# Patient Record
Sex: Female | Born: 1975 | Race: White | Hispanic: No | Marital: Single | Smoking: Current every day smoker
Health system: Southern US, Community
[De-identification: ages and names within clinical notes are randomized; demographics above are authoritative.]

## PROBLEM LIST (undated history)

## (undated) VITALS — BP 120/73 | HR 108 | Temp 98.8°F | Resp 18 | Ht 60.0 in | Wt 154.0 lb

## (undated) DIAGNOSIS — F32A Depression, unspecified: Secondary | ICD-10-CM

## (undated) DIAGNOSIS — F319 Bipolar disorder, unspecified: Secondary | ICD-10-CM

## (undated) DIAGNOSIS — F329 Major depressive disorder, single episode, unspecified: Secondary | ICD-10-CM

## (undated) HISTORY — PX: TUBAL LIGATION: SHX77

---

## 2002-07-11 ENCOUNTER — Inpatient Hospital Stay (HOSPITAL_COMMUNITY): Admission: EM | Admit: 2002-07-11 | Discharge: 2002-07-15 | Payer: Self-pay | Admitting: *Deleted

## 2003-07-06 ENCOUNTER — Inpatient Hospital Stay (HOSPITAL_COMMUNITY): Admission: AD | Admit: 2003-07-06 | Discharge: 2003-07-12 | Payer: Self-pay | Admitting: Psychiatry

## 2007-02-01 ENCOUNTER — Inpatient Hospital Stay (HOSPITAL_COMMUNITY): Admission: AD | Admit: 2007-02-01 | Discharge: 2007-02-01 | Payer: Self-pay | Admitting: Obstetrics and Gynecology

## 2007-05-21 ENCOUNTER — Inpatient Hospital Stay (HOSPITAL_COMMUNITY): Admission: AD | Admit: 2007-05-21 | Discharge: 2007-05-21 | Payer: Self-pay | Admitting: Obstetrics and Gynecology

## 2007-07-31 ENCOUNTER — Inpatient Hospital Stay (HOSPITAL_COMMUNITY): Admission: AD | Admit: 2007-07-31 | Discharge: 2007-07-31 | Payer: Self-pay | Admitting: Obstetrics and Gynecology

## 2007-10-09 ENCOUNTER — Inpatient Hospital Stay (HOSPITAL_COMMUNITY): Admission: AD | Admit: 2007-10-09 | Discharge: 2007-10-11 | Payer: Self-pay | Admitting: Obstetrics & Gynecology

## 2010-07-21 ENCOUNTER — Inpatient Hospital Stay (HOSPITAL_COMMUNITY)
Admission: AD | Admit: 2010-07-21 | Discharge: 2010-07-22 | Payer: Self-pay | Source: Home / Self Care | Admitting: Obstetrics and Gynecology

## 2010-07-21 ENCOUNTER — Ambulatory Visit: Payer: Self-pay | Admitting: Nurse Practitioner

## 2011-03-10 LAB — CBC
HCT: 37.9 % (ref 36.0–46.0)
MCH: 32.3 pg (ref 26.0–34.0)
MCHC: 34.4 g/dL (ref 30.0–36.0)
MCV: 94 fL (ref 78.0–100.0)
RDW: 13.3 % (ref 11.5–15.5)

## 2011-03-10 LAB — POCT PREGNANCY, URINE: Preg Test, Ur: NEGATIVE

## 2011-03-10 LAB — WET PREP, GENITAL: Trich, Wet Prep: NONE SEEN

## 2011-03-10 LAB — GC/CHLAMYDIA PROBE AMP, GENITAL: GC Probe Amp, Genital: NEGATIVE
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
## 2011-10-03 LAB — CBC
HCT: 29.4 — ABNORMAL LOW
HCT: 36.5
Hemoglobin: 12.8
MCHC: 34.2
MCV: 93.2
MCV: 94.1
Platelets: 229
Platelets: 280
RBC: 3.91
RDW: 13.1
RDW: 13.3
WBC: 13.7 — ABNORMAL HIGH

## 2011-10-03 LAB — RPR: RPR Ser Ql: NONREACTIVE

## 2011-10-08 LAB — URINALYSIS, ROUTINE W REFLEX MICROSCOPIC
Bilirubin Urine: NEGATIVE
Glucose, UA: NEGATIVE
Hgb urine dipstick: NEGATIVE
Ketones, ur: NEGATIVE
Protein, ur: NEGATIVE

## 2014-06-20 ENCOUNTER — Encounter (HOSPITAL_COMMUNITY): Payer: Self-pay | Admitting: Emergency Medicine

## 2014-06-20 ENCOUNTER — Emergency Department (HOSPITAL_COMMUNITY): Payer: 59

## 2014-06-20 ENCOUNTER — Emergency Department (HOSPITAL_COMMUNITY)
Admission: EM | Admit: 2014-06-20 | Discharge: 2014-06-21 | Disposition: A | Discharge status: Other Acute Inpatient Hospital | Payer: 59 | Attending: Emergency Medicine | Admitting: Emergency Medicine

## 2014-06-20 DIAGNOSIS — F17200 Nicotine dependence, unspecified, uncomplicated: Secondary | ICD-10-CM | POA: Insufficient documentation

## 2014-06-20 DIAGNOSIS — IMO0002 Reserved for concepts with insufficient information to code with codable children: Secondary | ICD-10-CM | POA: Insufficient documentation

## 2014-06-20 DIAGNOSIS — F1310 Sedative, hypnotic or anxiolytic abuse, uncomplicated: Secondary | ICD-10-CM | POA: Insufficient documentation

## 2014-06-20 DIAGNOSIS — T43502A Poisoning by unspecified antipsychotics and neuroleptics, intentional self-harm, initial encounter: Secondary | ICD-10-CM | POA: Insufficient documentation

## 2014-06-20 DIAGNOSIS — T43501A Poisoning by unspecified antipsychotics and neuroleptics, accidental (unintentional), initial encounter: Secondary | ICD-10-CM | POA: Insufficient documentation

## 2014-06-20 DIAGNOSIS — F319 Bipolar disorder, unspecified: Secondary | ICD-10-CM | POA: Insufficient documentation

## 2014-06-20 DIAGNOSIS — Z3202 Encounter for pregnancy test, result negative: Secondary | ICD-10-CM | POA: Insufficient documentation

## 2014-06-20 DIAGNOSIS — R000 Tachycardia, unspecified: Secondary | ICD-10-CM | POA: Insufficient documentation

## 2014-06-20 DIAGNOSIS — T438X2A Poisoning by other psychotropic drugs, intentional self-harm, initial encounter: Secondary | ICD-10-CM | POA: Insufficient documentation

## 2014-06-20 DIAGNOSIS — T50902A Poisoning by unspecified drugs, medicaments and biological substances, intentional self-harm, initial encounter: Secondary | ICD-10-CM

## 2014-06-20 HISTORY — DX: Depression, unspecified: F32.A

## 2014-06-20 HISTORY — DX: Major depressive disorder, single episode, unspecified: F32.9

## 2014-06-20 HISTORY — DX: Bipolar disorder, unspecified: F31.9

## 2014-06-20 LAB — COMPREHENSIVE METABOLIC PANEL
ALT: 16 U/L (ref 0–35)
AST: 13 U/L (ref 0–37)
Albumin: 3.7 g/dL (ref 3.5–5.2)
Alkaline Phosphatase: 56 U/L (ref 39–117)
BUN: 5 mg/dL — ABNORMAL LOW (ref 6–23)
CO2: 24 mEq/L (ref 19–32)
Calcium: 8.9 mg/dL (ref 8.4–10.5)
Chloride: 97 mEq/L (ref 96–112)
Creatinine, Ser: 0.6 mg/dL (ref 0.50–1.10)
GFR calc Af Amer: >90 mL/min (ref >90)
GFR calc non Af Amer: >90 mL/min (ref >90)
Glucose, Bld: 65 mg/dL — ABNORMAL LOW (ref 70–99)
Potassium: 3.4 mEq/L — ABNORMAL LOW (ref 3.7–5.3)
Sodium: 137 mEq/L (ref 137–147)
Total Bilirubin: <0.2 mg/dL — ABNORMAL LOW (ref 0.3–1.2)
Total Protein: 6.9 g/dL (ref 6.0–8.3)

## 2014-06-20 LAB — RAPID URINE DRUG SCREEN, HOSP PERFORMED
Amphetamines: NOT DETECTED
Barbiturates: POSITIVE — AB
Benzodiazepines: NOT DETECTED
Cocaine: NOT DETECTED
Opiates: NOT DETECTED
Tetrahydrocannabinol: NOT DETECTED

## 2014-06-20 LAB — CBC
HCT: 38 % (ref 36.0–46.0)
Hemoglobin: 12.8 g/dL (ref 12.0–15.0)
MCH: 30.6 pg (ref 26.0–34.0)
MCHC: 33.7 g/dL (ref 30.0–36.0)
MCV: 90.9 fL (ref 78.0–100.0)
Platelets: 214 10*3/uL (ref 150–400)
RBC: 4.18 MIL/uL (ref 3.87–5.11)
RDW: 13 % (ref 11.5–15.5)
WBC: 7.3 10*3/uL (ref 4.0–10.5)

## 2014-06-20 LAB — CBG MONITORING, ED: Glucose-Capillary: 117 mg/dL — ABNORMAL HIGH (ref 70–99)

## 2014-06-20 LAB — ACETAMINOPHEN LEVEL: Acetaminophen (Tylenol), Serum: <15 ug/mL (ref 10–30)

## 2014-06-20 LAB — LACTIC ACID, PLASMA: Lactic Acid, Venous: 2.4 mmol/L — ABNORMAL HIGH (ref 0.5–2.2)

## 2014-06-20 LAB — POC URINE PREG, ED: Preg Test, Ur: NEGATIVE

## 2014-06-20 LAB — ETHANOL: Alcohol, Ethyl (B): <11 mg/dL (ref 0–11)

## 2014-06-20 LAB — SALICYLATE LEVEL: Salicylate Lvl: <2 mg/dL — ABNORMAL LOW (ref 2.8–20.0)

## 2014-06-20 LAB — MAGNESIUM: Magnesium: 2.1 mg/dL (ref 1.5–2.5)

## 2014-06-20 MED ORDER — SODIUM CHLORIDE 0.9 % IV BOLUS (SEPSIS)
1000.0000 mL | Freq: Once | INTRAVENOUS | Status: AC
  Administered 2014-06-20: 1000 mL via INTRAVENOUS

## 2014-06-20 MED ORDER — ONDANSETRON HCL 4 MG PO TABS
4.0000 mg | ORAL_TABLET | Freq: Three times a day (TID) | ORAL | Status: DC | PRN
  Administered 2014-06-21: 4 mg via ORAL
  Filled 2014-06-20: qty 1

## 2014-06-20 MED ORDER — SODIUM CHLORIDE 0.9 % IV SOLN
Freq: Once | INTRAVENOUS | Status: AC
  Administered 2014-06-20: 18:00:00 via INTRAVENOUS

## 2014-06-20 MED ORDER — ACETAMINOPHEN 325 MG PO TABS: 650.0000 mg | ORAL_TABLET | ORAL | Status: DC | PRN
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
## 2014-06-20 NOTE — ED Notes (Signed)
MD at bedside. 

## 2014-06-20 NOTE — ED Notes (Signed)
Father at bedside. Pt alert to self , not time, place or situation. Pt states she took a lot of medicine, Seroquel. Father has belongings at bedside.

## 2014-06-20 NOTE — ED Notes (Signed)
Notified Dr. Juleen ChinaKohut of pt BP

## 2014-06-20 NOTE — ED Notes (Signed)
TTS notified with update on pt status.

## 2014-06-20 NOTE — ED Notes (Signed)
Pt has been wanded and belongings given to family. House coverage notified.

## 2014-06-20 NOTE — BH Assessment (Signed)
[facility] will [facility] up TA equipment.   Dr. Juleen ChinaKohut reports Pt is up, alert, recently ate something and is ready to be assessed.   Clista BernhardtNancy Stephenson, [facility] Triage Specialist 06/20/2014 7:21 PM

## 2014-06-20 NOTE — BH Assessment (Signed)
[facility] Assessment Progress Note  Spoke with Dr. Juleen ChinaKohut, took history of pt.  He states that pt is too drowsy to be assessed, but that pt said she overdosed on Seroquel (she takes other meds and may have taken those, too, but could not relay that information to MD).  Father reports that he found pt in a hotel and that pt told him yesterday it was a suicide attempt. He could not get any more information out of her and says she is too drowsy for assessment at this time.  Talked with Pod E Rn who agrees and says they will call TTS when pt is ready to assess.
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
## 2014-06-20 NOTE — ED Notes (Addendum)
Pt family sts that he found her at a motel and she possibly has OD on Seroquel. Pt very slurred in her speech. sts she took some muscle relaxer. Per pt sts that this was an attempt to commit suicide. Pt SI and HI

## 2014-06-20 NOTE — ED Notes (Signed)
Pt speaking TTS couselor via telepysch.

## 2014-06-20 NOTE — ED Notes (Signed)
Dr.Kohut notified of VS

## 2014-06-20 NOTE — ED Notes (Signed)
Patient reports that she does not want to see her husband if he comes to visit. Explained hospital use of XXX patient status.  Patient understands, and decides not to be marked as XXX at this time.  She does not want her husband to visit, but will allow other visitors during visiting hours.

## 2014-06-20 NOTE — ED Notes (Addendum)
TTS attempt to reach pt. RN noted pt not alert to answer questions. Pt responds to voice, occasionally pain.

## 2014-06-21 ENCOUNTER — Encounter (HOSPITAL_COMMUNITY): Payer: Self-pay | Admitting: *Deleted

## 2014-06-21 ENCOUNTER — Inpatient Hospital Stay (HOSPITAL_COMMUNITY)
Admission: EM | Admit: 2014-06-21 | Discharge: 2014-06-28 | DRG: 885 | Disposition: A | Discharge status: Home / Self Care | Payer: 59 | Source: Intra-hospital | Attending: Psychiatry | Admitting: Psychiatry

## 2014-06-21 DIAGNOSIS — G4700 Insomnia, unspecified: Secondary | ICD-10-CM | POA: Diagnosis present

## 2014-06-21 DIAGNOSIS — F4310 Post-traumatic stress disorder, unspecified: Secondary | ICD-10-CM | POA: Diagnosis present

## 2014-06-21 DIAGNOSIS — F410 Panic disorder [episodic paroxysmal anxiety] without agoraphobia: Secondary | ICD-10-CM | POA: Diagnosis present

## 2014-06-21 DIAGNOSIS — F329 Major depressive disorder, single episode, unspecified: Secondary | ICD-10-CM | POA: Diagnosis present

## 2014-06-21 DIAGNOSIS — R45851 Suicidal ideations: Secondary | ICD-10-CM

## 2014-06-21 DIAGNOSIS — Z5900 Homelessness unspecified: Secondary | ICD-10-CM

## 2014-06-21 DIAGNOSIS — F17200 Nicotine dependence, unspecified, uncomplicated: Secondary | ICD-10-CM | POA: Diagnosis present

## 2014-06-21 DIAGNOSIS — F411 Generalized anxiety disorder: Secondary | ICD-10-CM | POA: Diagnosis present

## 2014-06-21 DIAGNOSIS — IMO0001 Reserved for inherently not codable concepts without codable children: Secondary | ICD-10-CM | POA: Diagnosis present

## 2014-06-21 DIAGNOSIS — Z818 Family history of other mental and behavioral disorders: Secondary | ICD-10-CM

## 2014-06-21 DIAGNOSIS — G4710 Hypersomnia, unspecified: Secondary | ICD-10-CM | POA: Diagnosis present

## 2014-06-21 DIAGNOSIS — F3181 Bipolar II disorder: Secondary | ICD-10-CM | POA: Diagnosis present

## 2014-06-21 DIAGNOSIS — M62838 Other muscle spasm: Secondary | ICD-10-CM | POA: Diagnosis present

## 2014-06-21 DIAGNOSIS — F1020 Alcohol dependence, uncomplicated: Secondary | ICD-10-CM | POA: Diagnosis present

## 2014-06-21 DIAGNOSIS — Z598 Other problems related to housing and economic circumstances: Secondary | ICD-10-CM

## 2014-06-21 DIAGNOSIS — F3160 Bipolar disorder, current episode mixed, unspecified: Principal | ICD-10-CM | POA: Diagnosis present

## 2014-06-21 DIAGNOSIS — Z5987 Material hardship due to limited financial resources, not elsewhere classified: Secondary | ICD-10-CM

## 2014-06-21 MED ORDER — QUETIAPINE FUMARATE 200 MG PO TABS
200.0000 mg | ORAL_TABLET | Freq: Every day | ORAL | Status: DC
  Administered 2014-06-22 – 2014-06-27 (×6): 200 mg via ORAL
  Filled 2014-06-21 (×7): qty 1
  Filled 2014-06-21: qty 14
  Filled 2014-06-21: qty 1

## 2014-06-21 MED ORDER — ROPINIROLE HCL 1 MG PO TABS
0.5000 mg | ORAL_TABLET | Freq: Three times a day (TID) | ORAL | Status: DC
  Administered 2014-06-22 – 2014-06-28 (×20): 0.5 mg via ORAL
  Filled 2014-06-21 (×6): qty 1
  Filled 2014-06-21: qty 21
  Filled 2014-06-21: qty 2
  Filled 2014-06-21 (×3): qty 1
  Filled 2014-06-21: qty 21
  Filled 2014-06-21 (×2): qty 1
  Filled 2014-06-21: qty 21
  Filled 2014-06-21 (×13): qty 1

## 2014-06-21 MED ORDER — MAGNESIUM HYDROXIDE 400 MG/5ML PO SUSP: 30.0000 mL | Freq: Every day | ORAL | Status: DC | PRN

## 2014-06-21 MED ORDER — ALUM & MAG HYDROXIDE-SIMETH 200-200-20 MG/5ML PO SUSP
30.0000 mL | ORAL | Status: DC | PRN
  Administered 2014-06-26: 30 mL via ORAL

## 2014-06-21 MED ORDER — CYCLOBENZAPRINE HCL 10 MG PO TABS
10.0000 mg | ORAL_TABLET | Freq: Three times a day (TID) | ORAL | Status: DC | PRN
  Administered 2014-06-23 – 2014-06-27 (×9): 10 mg via ORAL
  Filled 2014-06-21 (×3): qty 2
  Filled 2014-06-21: qty 30
  Filled 2014-06-21 (×6): qty 2

## 2014-06-21 MED ORDER — HYDROXYZINE HCL 50 MG PO TABS
50.0000 mg | ORAL_TABLET | ORAL | Status: DC | PRN
  Administered 2014-06-22 – 2014-06-27 (×6): 50 mg via ORAL
  Filled 2014-06-21 (×6): qty 1

## 2014-06-21 MED ORDER — QUETIAPINE FUMARATE 50 MG PO TABS
50.0000 mg | ORAL_TABLET | Freq: Two times a day (BID) | ORAL | Status: DC
  Administered 2014-06-21 – 2014-06-28 (×14): 50 mg via ORAL
  Filled 2014-06-21: qty 28
  Filled 2014-06-21 (×8): qty 1
  Filled 2014-06-21: qty 28
  Filled 2014-06-21 (×10): qty 1

## 2014-06-21 MED ORDER — GABAPENTIN 300 MG PO CAPS
300.0000 mg | ORAL_CAPSULE | Freq: Three times a day (TID) | ORAL | Status: DC
  Administered 2014-06-22 – 2014-06-23 (×4): 300 mg via ORAL
  Filled 2014-06-21 (×8): qty 1

## 2014-06-21 MED ORDER — GI COCKTAIL ~~LOC~~
30.0000 mL | Freq: Once | ORAL | Status: AC
  Administered 2014-06-21: 30 mL via ORAL
  Filled 2014-06-21: qty 30

## 2014-06-21 MED ORDER — PRAZOSIN HCL 1 MG PO CAPS
1.0000 mg | ORAL_CAPSULE | Freq: Every day | ORAL | Status: DC
Start: 2014-06-21 — End: 2014-06-28
  Administered 2014-06-21 – 2014-06-27 (×7): 1 mg via ORAL
  Filled 2014-06-21: qty 14
  Filled 2014-06-21 (×9): qty 1

## 2014-06-21 MED ORDER — ONDANSETRON HCL 4 MG PO TABS
4.0000 mg | ORAL_TABLET | Freq: Three times a day (TID) | ORAL | Status: DC | PRN
  Administered 2014-06-22: 4 mg via ORAL
  Filled 2014-06-21: qty 1

## 2014-06-21 MED ORDER — ACETAMINOPHEN 325 MG PO TABS
650.0000 mg | ORAL_TABLET | Freq: Four times a day (QID) | ORAL | Status: DC | PRN
  Administered 2014-06-22 – 2014-06-25 (×5): 650 mg via ORAL
  Filled 2014-06-21 (×5): qty 2

## 2014-06-21 MED ORDER — FLUOXETINE HCL 20 MG PO CAPS
40.0000 mg | ORAL_CAPSULE | Freq: Every day | ORAL | Status: DC
  Administered 2014-06-22: 40 mg via ORAL
  Filled 2014-06-21 (×4): qty 2

## 2014-06-21 NOTE — BH Assessment (Signed)
Per Rosey BathKelly Southard, [facility], adult unit is currently at capacity. Contacted the following facilities for placement:  BED AVAILABLE, FAXED CLINICAL INFORMATION: [facility], Per Robyn   AT CAPACITY:  [facility], per [facility]  [facility], per Earl LitesJennifer  [facility], Per [facility]  [facility], Per [facility]  [facility], per [facility]  [facility], per [facility]  [place], Per [facility]  [facility], per [facility]  [facility], per Meryle ReadyWanda  [facility], per [facility]  [place], per Darvin Neighbourshristine  [facility], per [facility]  [facility], per [facility]  [facility], per [facility]  [facility], per [facility]   Ford Ellis Patsy BaltimoreWarrick Jr, [facility], [facility] Triage Specialist [phone]

## 2014-06-21 NOTE — ED Provider Notes (Signed)
Pt awake and alert, vitals normal. Discussed with psych team - awaiting inpatient psych placement.     Suzi RootsKevin E Steinl, MD 06/21/14 [phone]

## 2014-06-21 NOTE — ED Notes (Signed)
Reported to Dr. Lavella LemonsManly that patient is complaining of heart burn. MD acknowledges, and gives verbal orders for gi cocktail.

## 2014-06-22 DIAGNOSIS — F3130 Bipolar disorder, current episode depressed, mild or moderate severity, unspecified: Secondary | ICD-10-CM

## 2014-06-22 DIAGNOSIS — T43502A Poisoning by unspecified antipsychotics and neuroleptics, intentional self-harm, initial encounter: Secondary | ICD-10-CM

## 2014-06-22 DIAGNOSIS — T43501A Poisoning by unspecified antipsychotics and neuroleptics, accidental (unintentional), initial encounter: Secondary | ICD-10-CM

## 2014-06-22 DIAGNOSIS — T438X2A Poisoning by other psychotropic drugs, intentional self-harm, initial encounter: Secondary | ICD-10-CM

## 2014-06-22 DIAGNOSIS — T43294A Poisoning by other antidepressants, undetermined, initial encounter: Secondary | ICD-10-CM

## 2014-06-22 DIAGNOSIS — R45851 Suicidal ideations: Secondary | ICD-10-CM

## 2014-06-22 LAB — URINALYSIS, ROUTINE W REFLEX MICROSCOPIC
BILIRUBIN URINE: NEGATIVE
Glucose, UA: NEGATIVE mg/dL
HGB URINE DIPSTICK: NEGATIVE
Ketones, ur: NEGATIVE mg/dL
NITRITE: NEGATIVE
PH: 7.5 (ref 5.0–8.0)
Protein, ur: NEGATIVE mg/dL
Specific Gravity, Urine: 1.005 (ref 1.005–1.030)
Urobilinogen, UA: 0.2 mg/dL (ref 0.0–1.0)

## 2014-06-22 LAB — URINE MICROSCOPIC-ADD ON

## 2014-06-22 LAB — TSH: TSH: 2.52 u[IU]/mL (ref 0.350–4.500)

## 2014-06-22 MED ORDER — NICOTINE 21 MG/24HR TD PT24
21.0000 mg | MEDICATED_PATCH | Freq: Every day | TRANSDERMAL | Status: DC
  Administered 2014-06-22 – 2014-06-28 (×7): 21 mg via TRANSDERMAL
  Filled 2014-06-22 (×9): qty 1

## 2014-06-22 MED ORDER — ESCITALOPRAM OXALATE 10 MG PO TABS
10.0000 mg | ORAL_TABLET | Freq: Every day | ORAL | Status: DC
  Administered 2014-06-22 – 2014-06-28 (×7): 10 mg via ORAL
  Filled 2014-06-22 (×6): qty 1
  Filled 2014-06-22: qty 14
  Filled 2014-06-22 (×2): qty 1

## 2014-06-22 MED ORDER — ENSURE COMPLETE PO LIQD
237.0000 mL | Freq: Two times a day (BID) | ORAL | Status: DC
  Administered 2014-06-24 – 2014-06-26 (×3): 237 mL via ORAL
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
## 2014-06-22 NOTE — Progress Notes (Signed)
D:  Patient's self inventory sheet, patient has poor sleep, poor appetite, low energy level, poor attention span.  Rated depression, hopeless and anxiety 8.   Continues to experience tremors, chilling, cravings.  SI off/on, contracts for safety.  Denied physical problems.  No discharge plans.  Needs financial assistance for medications. A:  Medications administered per MD orders.  Emotional support and encouragement given patient. R:  Denied HI.  Denied A/V hallucinations.  SI off/on, contracts for safety.  15 minute checks continue for safety.  Safety maintained.

## 2014-06-22 NOTE — Tx Team (Signed)
Interdisciplinary Treatment Plan Update   Date Reviewed:  06/22/2014  Time Reviewed:  8:34 AM  Progress in Treatment:   Attending groups: Yes Participating in groups: Yes Taking medication as prescribed: Yes  Tolerating medication: Yes Family/Significant other contact made:  No, but will ask patient for consent for collateral contact Patient understands diagnosis: Yes  Discussing patient identified problems/goals with staff: Yes Medical problems stabilized or resolved: Yes Denies suicidal/homicidal ideation: Yes Patient has not harmed self or others: Yes  For review of initial/current patient goals, please see plan of care.  Estimated Length of Stay:  3-5 days  Reasons for Continued Hospitalization:  Anxiety Depression Medication stabilization Suicidal ideation  New Problems/Goals identified:    Discharge Plan or Barriers:   Home with outpatient follow up to be determined  Additional Comments:  Attendees:  Patient:  06/22/2014 8:34 AM   Signature:  S. Arfeen, MD 06/22/2014 8:34 AM  Signature:  06/22/2014 8:34 AM  Signature:   Liborio NixonPatrice White, RN 06/22/2014 8:34 AM  Signature:Beverly Terrilee CroakKnight, RN 06/22/2014 8:34 AM  Signature:   06/22/2014 8:34 AM  Signature:  Juline PatchQuylle Hodnett, LCSW 06/22/2014 8:34 AM  Signature:  Reyes Ivanhelsea Horton, LCSW 06/22/2014 8:34 AM  Signature:  Leisa LenzValerie Enoch, Care Coordinator [facility] 06/22/2014 8:34 AM  Signature:   06/22/2014 8:34 AM  Signature: Leighton ParodyBritney Tyson, RN 06/22/2014  8:34 AM  Signature:   Onnie BoerJennifer Clark, RN [facility] 06/22/2014  8:34 AM  Signature: 06/22/2014  8:34 AM    Scribe for Treatment Team:   Juline PatchQuylle Hodnett,  06/22/2014 8:34 AM

## 2014-06-22 NOTE — Progress Notes (Signed)
NUTRITION ASSESSMENT  Pt identified as at risk on the Malnutrition Screen Tool  INTERVENTION: 1. Educated patient on the importance of nutrition and encouraged intake of food and beverages. 2. Supplements: Ensure Complete BID  NUTRITION DIAGNOSIS: Unintentional weight loss related to sub-optimal intake as evidenced by pt report.   Goal: Pt to meet >/= 90% of their estimated nutrition needs.  Monitor:  PO intake  Assessment:  Pt admitted because of severe depression and suicidal attempt.   - Met with pt who reports poor intake in the past 2 days, was eating things like fruit - States she may have lost 10 pounds in the past month unintentionally - Only thing she has had to eat today has been a few bites of apple cobbler at lunch - C/o having stomach pain, cramping, and nausea since overdose - Agreeable to getting Ensure Complete during admission   38 y.o. female  Height: Ht Readings from Last 1 Encounters:  06/21/14 5' (1.524 m)    Weight: Wt Readings from Last 1 Encounters:  06/21/14 115 lb (52.164 kg)    Weight Hx: Wt Readings from Last 10 Encounters:  06/21/14 115 lb (52.164 kg)    BMI:  Body mass index is 22.46 kg/(m^2). Pt meets criteria for normal weight based on current BMI.  Estimated Nutritional Needs: Kcal: 25-30 kcal/kg Protein: > 1 gram protein/kg Fluid: 1 ml/kcal  Diet Order: General Pt is also offered choice of unit snacks mid-morning and mid-afternoon.  Pt is eating as desired.   Lab results and medications reviewed. Potassium slightly low.   Carlis Stable MS, [place], LDN [phone] Pager [phone] Weekend/After Hours Pager

## 2014-06-22 NOTE — Progress Notes (Signed)
Recreation Therapy Notes  Animal-Assisted Activity/Therapy (AAA/T) Program Checklist/Progress Notes Patient Eligibility Criteria Checklist & Daily Group note for Rec Tx Intervention  Date: 06.30.2015 Time: 3:15pm Location: [address] Dayroom    AAA/T Program Assumption of Risk Form signed by Patient/ or Parent Legal Guardian yes  Patient is free of allergies or sever asthma yes  Patient reports no fear of animals yes  Patient reports no history of cruelty to animals yes   Patient understands his/her participation is voluntary yes  Patient washes hands before animal contact yes  Patient washes hands after animal contact yes  Behavioral Response: Appropriate   Education: Hand Washing, Appropriate Animal Interaction   Education Outcome: Acknowledges understanding   Clinical Observations/Feedback: Patient interacted appropriately with therapeutic dog team and peers during session.   Denise L Blanchfield, LRT/CTRS  Blanchfield, Denise L 06/22/2014 4:34 PM 
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
## 2014-06-22 NOTE — [facility] Group Notes (Signed)
[facility]    06/22/2014 3:35 PM  Type of Therapy:  Group Therapy  Participation Level:  Did Not Attend  Wynn BankerHodnett, Quylle Hairston 06/22/2014, 3:35 PM
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
## 2014-06-22 NOTE — Progress Notes (Signed)
The focus of this group is to educate the patient on the purpose and policies of crisis stabilization and provide a format to answer questions about their admission.  The group details unit policies and expectations of patients while admitted.  Patient attended 0900 nurse education orientation group this morning.  Patient actively participated, appropriate affect, alert, appropriate insight and engagement.  Today patient will work on 3 goals for discharge.  

## 2014-06-23 MED ORDER — LAMOTRIGINE 25 MG PO TABS
25.0000 mg | ORAL_TABLET | Freq: Two times a day (BID) | ORAL | Status: DC
  Administered 2014-06-23 – 2014-06-28 (×10): 25 mg via ORAL
  Filled 2014-06-23: qty 28
  Filled 2014-06-23 (×9): qty 1
  Filled 2014-06-23: qty 28
  Filled 2014-06-23 (×4): qty 1

## 2014-06-23 NOTE — [facility] Group Notes (Signed)
[facility] Group Notes:  (Nursing/MHT/Case Management/Adjunct)  Date:  06/23/2014  Time:  1:33 PM  Type of Therapy:  Psychoeducational Skills  Participation Level:  Did Not Attend  Participation Quality:  N/A  Affect:  N/A  Cognitive:  N/A  Insight:  None  Engagement in Group:  N/A  Modes of Intervention:  N/A  Summary of Progress/Problems: Pt. Did not attend group.   Tylene FantasiaRichardson, Neila Teem N 06/23/2014, 1:33 PM

## 2014-06-23 NOTE — [facility] Suicide Risk Assessment (Signed)
[facility] INPATIENT:  Family/Significant Other Suicide Prevention Education  Suicide Prevention Education:  Education Completed; Kayla Silva, Mother, [phone];  has been identified by the patient as the family member/significant other with whom the patient will be residing, and identified as the person(s) who will aid the patient in the event of a mental health crisis (suicidal ideations/suicide attempt).  With written consent from the patient, the family member/significant other has been provided the following suicide prevention education, prior to the and/or following the discharge of the patient.  The suicide prevention education provided includes the following:  Suicide risk factors  Suicide prevention and interventions  National Suicide Hotline telephone number  [facility] assessment telephone number  [facility] Emergency Assistance 911  [facility] and/or Residential Mobile Crisis Unit telephone number  Request made of family/significant other to:  Remove weapons (e.g., guns, rifles, knives), all items previously/currently identified as safety concern.  Mother advised patient does not have access to weapons.        Remove drugs/medications (over-the-counter, prescriptions, illicit drugs), all items previously/currently identified as a safety concern.  The family member/significant other verbalizes understanding of the suicide prevention education information provided.  The family member/significant other agrees to remove the items of safety concern listed above.  Kayla Silva, Kayla Silva 06/23/2014, 4:04 PM

## 2014-06-23 NOTE — [facility] Group Notes (Signed)
[facility] Emotional Regulation  06/23/2014 2:56 PM  Type of Therapy:  Group Therapy  Participation Level:  Did Not Attend  Wynn BankerHodnett, Amoria Mclees Hairston 06/23/2014, 2:56 PM

## 2014-06-23 NOTE — [facility] Group Notes (Signed)
[facility]   06/23/2014 12:37 PM    Participation Quality:  Appropraite  Mood/Affect:  Appropriate  Depression Rating:  7  Anxiety Rating:  7  Thoughts of Suicide:  No  Will you contract for safety?   NA  Current AVH:  No  Plan for Discharge/Comments:  Patient attended discharge planning group and actively participated in group. She is interested in residential treatment but [facility]  CSW provided all participants with daily workbook.   Transportation Means: Patient has transportation.   Supports:  Patient has a support system.   Jillian Pianka, Joesph JulyQuylle Hairston

## 2014-06-23 NOTE — Progress Notes (Signed)
[facility]  06/23/2014 10:46 AM Kayla Silva  MRN:  [id number]  Subjective:  I don't think my medicines are working.  I cannot sleep.  I still have a lot of irritability and suicidal thoughts.  Objective Patient seen chart reviewed.  Patient remains very depressed, tearful and continued to endorse suicidal thoughts.  She is complaining of racing thoughts and could not sleep last night.  She admitted some time lack of appetite and does not want to do anything.  She is complaining of hallucinations.  She is also complaining of muscle spasm.  She is taking Neurontin and she does not feel it is working with a female peer she also try a different medication.  She admitted hopelessness and worthlessness.  She is taking the medication denies any side effects.  However she has some shakes which she believes because of nervousness and anxiety.  She's going to the group but she has limited participation.  Diagnosis:   DSM5: Total Time spent with patient: 20 minutes  Axis I: Bipolar, Depressed Axis II: Deferred Axis III:  Past Medical History  Diagnosis Date  . Bipolar 1 disorder   . Depression    Axis IV: housing problems, other psychosocial or environmental problems and problems related to social environment  ADL's:  Impaired  Sleep: Poor  Appetite:  Fair  Psychiatric Specialty Exam: Physical Exam  Review of Systems  Musculoskeletal: Positive for joint pain and myalgias.  Psychiatric/Behavioral: Positive for depression, suicidal ideas and hallucinations. The patient is nervous/anxious and has insomnia.     Blood pressure 118/80, pulse 104, temperature 98.3 F (36.8 C), temperature source Oral, resp. rate 16, height 5' (1.524 m), weight 115 lb (52.164 kg), last menstrual period 06/06/2014.Body mass index is 22.46 kg/(m^2).  General Appearance: Casual  Eye Contact::  Fair  Speech:  Slow  Volume:  Decreased  Mood:  Depressed, Dysphoric, Hopeless and Irritable  Affect:   Constricted and Depressed  Thought Process:  Loose  Orientation:  Full (Time, Place, and Person)  Thought Content:  Hallucinations: Auditory and Rumination  Suicidal Thoughts:  Yes.  with intent/plan  Homicidal Thoughts:  No  Memory:  Immediate;   Fair Recent;   Fair Remote;   Fair  Judgement:  Impaired  Insight:  Lacking  Psychomotor Activity:  Decreased  Concentration:  Poor  Recall:  FiservFair  Fund of Knowledge:Fair  Language: Fair  Akathisia:  No  Handed:  Right  AIMS (if indicated):     Assets:  Communication Skills Desire for Improvement  Sleep:  Number of Hours: 6.5   Musculoskeletal: Strength & Muscle Tone: within normal limits Gait & Station: normal Patient leans: N/A  Current Medications: Current Facility-Administered Medications  Medication Dose Route Frequency Provider Last Rate Last Dose  . acetaminophen (TYLENOL) tablet 650 mg  650 mg Oral Q6H PRN Kerry HoughSpencer E Simon, PA-C   650 mg at 06/22/14 0815  . alum & mag hydroxide-simeth (MAALOX/MYLANTA) 200-200-20 MG/5ML suspension 30 mL  30 mL Oral Q4H PRN Kerry HoughSpencer E Simon, PA-C      . cyclobenzaprine (FLEXERIL) tablet 10 mg  10 mg Oral TID PRN Kerry HoughSpencer E Simon, PA-C      . escitalopram (LEXAPRO) tablet 10 mg  10 mg Oral Daily Cleotis NipperSyed T Obert Espindola, MD   10 mg at 06/23/14 0748  . feeding supplement (ENSURE COMPLETE) (ENSURE COMPLETE) liquid 237 mL  237 mL Oral BID BM Tenny CrawHeather S Winkler, RD      . hydrOXYzine (ATARAX/VISTARIL) tablet 50  mg  50 mg Oral Q4H PRN Kerry Hough, PA-C   50 mg at 06/23/14 1006  . lamoTRIgine (LAMICTAL) tablet 25 mg  25 mg Oral BID Cleotis Nipper, MD      . magnesium hydroxide (MILK OF MAGNESIA) suspension 30 mL  30 mL Oral Daily PRN Kerry Hough, PA-C      . nicotine (NICODERM CQ - dosed in mg/24 hours) patch 21 mg  21 mg Transdermal Daily Nehemiah Massed, MD   21 mg at 06/23/14 0748  . ondansetron (ZOFRAN) tablet 4 mg  4 mg Oral Q8H PRN Kerry Hough, PA-C   4 mg at 06/22/14 1610  . prazosin (MINIPRESS)  capsule 1 mg  1 mg Oral QHS Kerry Hough, PA-C   1 mg at 06/22/14 2137  . QUEtiapine (SEROQUEL) tablet 200 mg  200 mg Oral QHS Kerry Hough, PA-C   200 mg at 06/22/14 2137  . QUEtiapine (SEROQUEL) tablet 50 mg  50 mg Oral BID Nehemiah Massed, MD   50 mg at 06/23/14 0748  . rOPINIRole (REQUIP) tablet 0.5 mg  0.5 mg Oral TID Kerry Hough, PA-C   0.5 mg at 06/23/14 9604    Lab Results:  Results for orders placed during the hospital encounter of 06/21/14 (from the past 48 hour(s))  TSH     Status: None   Collection Time    06/22/14  6:20 AM      Result Value Ref Range   TSH 2.520  0.350 - 4.500 uIU/mL   Comment: Performed at [facility]  URINALYSIS, ROUTINE W REFLEX MICROSCOPIC     Status: Abnormal   Collection Time    06/22/14  3:30 PM      Result Value Ref Range   Color, Urine YELLOW  YELLOW   APPearance CLEAR  CLEAR   Specific Gravity, Urine 1.005  1.005 - 1.030   pH 7.5  5.0 - 8.0   Glucose, UA NEGATIVE  NEGATIVE mg/dL   Hgb urine dipstick NEGATIVE  NEGATIVE   Bilirubin Urine NEGATIVE  NEGATIVE   Ketones, ur NEGATIVE  NEGATIVE mg/dL   Protein, ur NEGATIVE  NEGATIVE mg/dL   Urobilinogen, UA 0.2  0.0 - 1.0 mg/dL   Nitrite NEGATIVE  NEGATIVE   Leukocytes, UA MODERATE (*) NEGATIVE   Comment: Performed at [facility]  URINE MICROSCOPIC-ADD ON     Status: Abnormal   Collection Time    06/22/14  3:30 PM      Result Value Ref Range   Squamous Epithelial / LPF FEW (*) RARE   WBC, UA 7-10  <3 WBC/hpf   RBC / HPF 0-2  <3 RBC/hpf   Bacteria, UA FEW (*) RARE   Comment: Performed at [facility]    Physical Findings: AIMS: Facial and Oral Movements Muscles of Facial Expression: None, normal Lips and Perioral Area: None, normal Jaw: None, normal Tongue: None, normal,Extremity Movements Upper (arms, wrists, hands, fingers): None, normal Lower (legs, knees, ankles, toes): None, normal, Trunk Movements Neck, shoulders, hips: None,  normal, Overall Severity Severity of abnormal movements (highest score from questions above): None, normal Incapacitation due to abnormal movements: None, normal Patient's awareness of abnormal movements (rate only patient's report): No Awareness, Dental Status Current problems with teeth and/or dentures?: No Does patient usually wear dentures?: No  CIWA:  CIWA-Ar Total: 4 COWS:  COWS Total Score: 5  Treatment Plan Summary: Daily contact with patient to assess and evaluate symptoms  and progress in treatment Medication management, discontinue Neurontin since patient does not see any improvement.  I will start Lamictal 25 mg twice a day.  Explained risks and benefits of medication especially rash and that case she needs to notify the staff immediately.  Encouraged to participate in group milieu therapy.  Review blood work and psychosocial stressors.  Estimated length of stay 3-4 days.   Medical Decision Making Problem Points:  Established problem, worsening (2), New problem, with additional work-up planned (4), Review of last therapy session (1) and Review of psycho-social stressors (1) Data Points:  Review or order clinical lab tests (1) Review of medication regiment & side effects (2) Review of new medications or change in dosage (2)  I certify that inpatient services furnished can reasonably be expected to improve the patient's condition.   Kaelen Brennan T. 06/23/2014, 10:46 AM

## 2014-06-23 NOTE — Progress Notes (Signed)
Did not attend group 

## 2014-06-23 NOTE — Progress Notes (Signed)
D: Pt passive SI-contracts for safety Pt denies HI/AVH. Pt is pleasant and cooperative. Pt goal for today is to work on her communication with her mother.   A: Pt was offered support and encouragement. Pt was given scheduled medications. Pt was encourage to attend groups. Q 15 minute checks were done for safety.   R:Pt attends groups and interacts well with peers and staff. Pt is taking medication. Pt has no complaints at this time.Pt receptive to treatment and safety maintained on unit.

## 2014-06-23 NOTE — Progress Notes (Signed)
D: Patient's affect appropriate to circumstance and mood is anxious. She reported on the self inventory sheet that she's sleeping fair, appetite improving, energy level is low and ability to pay attention is poor. Patient rates depression and feelings of hopelessness "7". She's actively participating in groups and interacting with peers in the dayroom. Compliant with all medications.  A: Support and encouragement provided to patient. Scheduled medications administered per MD orders. Maintain Q15 minute checks for safety.  R: Patient receptive. Endorses passive SI, but contracts for safety. Denies HI and auditory/visual hallucinations. Patient remains safe on the unit.

## 2014-06-23 NOTE — Progress Notes (Signed)
[facility] Group Notes:  (Nursing/MHT/Case Management/Adjunct)  Date:  06/23/2014  Time:  2:46 AM  Type of Therapy:  Group Therapy  Participation Level:  Active  Participation Quality:  Appropriate  Affect:  Appropriate  Cognitive:  Alert  Insight:  Appropriate  Engagement in Group:  Engaged  Modes of Intervention:  Discussion  Summary of Progress/Problems: Patient says the day started off rocky but she began getting adjusted as the day went on. Patient says that the positive thing that happened was that she was able to get in touch with her friends in order to get some of her things brought to her.  [name],[name] 06/23/2014, 2:46 AM

## 2014-06-23 NOTE — Progress Notes (Signed)
D: Pt denies SI/HI/AVH/ pain at this time . Pt is pleasant and cooperative. [facility] to reduce chance of relapse or possible OD.   A: Pt was offered support and encouragement. Pt was given scheduled medications. Pt was encourage to attend groups. Q 15 minute checks were done for safety.   R:Pt attends groups and interacts well with peers and staff. Pt is taking medication. Pt has no complaints at this time .Pt receptive to treatment and safety maintained on unit.

## 2014-06-24 LAB — URINALYSIS W MICROSCOPIC (NOT AT ARMC)
Bilirubin Urine: NEGATIVE
GLUCOSE, UA: NEGATIVE mg/dL
HGB URINE DIPSTICK: NEGATIVE
KETONES UR: NEGATIVE mg/dL
Nitrite: NEGATIVE
PROTEIN: NEGATIVE mg/dL
Specific Gravity, Urine: 1.005 (ref 1.005–1.030)
UROBILINOGEN UA: 0.2 mg/dL (ref 0.0–1.0)
pH: 7 (ref 5.0–8.0)

## 2014-06-24 NOTE — Progress Notes (Signed)
D: Patient has appropriate affect and anxious mood. She reported on the self inventory sheet that she's sleeping fair, appetite is improving, energy level is low and ability to pay attention is poor. Patient rated depression and feelings of hopelessness "7". She's attending groups and socializing with peers in the hallways and dayroom. Patient in adherence with medication regimen.  A: Support and encouragement provided to patient. Administered scheduled medications per ordering MD. Monitor Q15 minute checks for safety.  R: Patient receptive. Passive SI, but contracts for safety. Denies HI. Patient remains safe on the unit.
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
## 2014-06-24 NOTE — Progress Notes (Signed)
D: Pt denies SI/HI/AVH. Pt is pleasant and cooperative. Pt stated [facility], but her anxiety has been very high lately. Pt had bright cheerful affect after coming from [place].   A: Pt was offered support and encouragement. Pt was given scheduled medications. Pt was encourage to attend groups. Q 15 minute checks were done for safety.   R:Pt attends groups and interacts well with peers and staff. Pt is taking medication. Pt has no complaints.Pt receptive to treatment and safety maintained on unit.

## 2014-06-24 NOTE — [facility] Group Notes (Signed)
[facility]  [facility] 1:15 - 2:30 PM   06/24/2014        Type of Therapy:  Group Therapy  Participation Level:  Did Not Attend   Kayla Silva, Kayla Silva 06/24/2014

## 2014-06-24 NOTE — Progress Notes (Signed)
Recreation Therapy Notes  Animal-Assisted Activity/Therapy (AAA/T) Program Checklist/Progress Notes Patient Eligibility Criteria Checklist & Daily Group note for Rec Tx Intervention  Date: 07.02.2015 Time: 3:15pm [address]   AAA/T Program Assumption of Risk Form signed by Patient/ or Parent Legal Guardian yes  Patient is free of allergies or sever asthma yes  Patient reports no fear of animals yes  Patient reports no history of cruelty to animals yes   Patient understands his/her participation is voluntary yes  Patient washes hands before animal contact yes  Patient washes hands after animal contact yes  Behavioral Response: Appropriate   Education: Hand Washing, Appropriate Animal Interaction   Education Outcome: Acknowledges understanding   Clinical Observations/Feedback: Patient interacted appropriately with therapy dog team.   Jearl Klinefelterenise L Rufina Kimery, LRT/CTRS        Verbena Boeding L 06/24/2014 4:18 PM
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
## 2014-06-25 LAB — URINE CULTURE

## 2014-06-25 MED ORDER — ACAMPROSATE CALCIUM 333 MG PO TBEC
666.0000 mg | DELAYED_RELEASE_TABLET | Freq: Three times a day (TID) | ORAL | Status: DC
  Administered 2014-06-25 – 2014-06-28 (×9): 666 mg via ORAL
  Filled 2014-06-25: qty 2
  Filled 2014-06-25 (×2): qty 84
  Filled 2014-06-25 (×6): qty 2
  Filled 2014-06-25: qty 84
  Filled 2014-06-25 (×4): qty 2

## 2014-06-25 NOTE — [facility] Group Notes (Signed)
[facility]   06/25/2014 12:36 PM    Participation Quality:  Patient did not attend group.  Shastina Rua, Joesph JulyQuylle Hairston

## 2014-06-25 NOTE — Progress Notes (Signed)
D: Pt denies SI/HI/AVH. Pt is pleasant and cooperative. Pt observed interacting on the milieu.  A: Pt was offered support and encouragement. Pt was given scheduled medications. Pt was encourage to attend groups. Q 15 minute checks were done for safety.   R:Pt attends groups and interacts well with peers and staff. Pt is taking medication. Pt has no complaints at this time.Pt receptive to treatment and safety maintained on unit.

## 2014-06-25 NOTE — Progress Notes (Addendum)
[facility] MD Progress Note  06/25/2014 2:20 PM Kayla Silva  MRN:  [id number] Subjective: Reports some improvement but still feels depressed and today " I feel like keeping to myself, isolating" Objective: I have discussed case with treatment team and have met with patient. Reports partial improvement but ongoing depression and a desire to minimize social interactions/keep to self. She does state she has gone to some groups and has been visible in day room. Behavior on unit in good control. She is tolerating medications well and denies side effects. She discussed her alcohol abuse history and reports significant difficulties with cravings to drink. We discussed options and she agrees to [facility] trial.  She is hoping to go to an inpatient rehab setting after discharge, in order to continue working on early recovery and relapse prevention efforts.  Diagnosis:   Alcohol Abuse, Bipolar, Depressed and Post Traumatic Stress Disorder  Total Time spent with patient: 20 minutes    ADL's: fair  Sleep: improved  Appetite:  fair  Suicidal Ideation:  Denies  Homicidal Ideation:  Denies  AEB (as evidenced by):  Psychiatric Specialty Exam: Physical Exam  Review of Systems  Constitutional: Negative for fever and chills.  Respiratory: Negative for cough and shortness of breath.   Cardiovascular: Negative for chest pain.  Psychiatric/Behavioral: Positive for depression and substance abuse. Negative for suicidal ideas and hallucinations.    Blood pressure 125/87, pulse 99, temperature 97.6 F (36.4 C), temperature source Oral, resp. rate 14, height 5' (1.524 m), weight 52.164 kg (115 lb), last menstrual period 06/06/2014, SpO2 97.00%.Body mass index is 22.46 kg/(m^2).  General Appearance: Fairly Groomed  Engineer, water::  Good  Speech:  Normal Rate  Volume:  Normal  Mood:  Depressed  Affect:  Constricted  Thought Process:  Goal Directed and Linear  Orientation:  Full (Time, Place, and Person)   Thought Content:  no psychotic symptoms  Suicidal Thoughts:  No- currently denies any suicidal ideations  Homicidal Thoughts:  No  Memory:  NA  Judgement:  Fair  Insight:  Fair  Psychomotor Activity:  Normal  Concentration:  Fair  Recall:  Good  Fund of Knowledge:Good  Language: Good  Akathisia:  Negative  Handed:  Right  AIMS (if indicated):     Assets:  Communication Skills Desire for Improvement  Sleep:  Number of Hours: 6.75   Musculoskeletal: Strength & Muscle Tone: within normal limits Gait & Station: normal Patient leans: N/A  Current Medications: Current Facility-Administered Medications  Medication Dose Route Frequency Provider Last Rate Last Dose  . acetaminophen (TYLENOL) tablet 650 mg  650 mg Oral Q6H PRN Laverle Hobby, PA-C   650 mg at 06/25/14 1022  . alum & mag hydroxide-simeth (MAALOX/MYLANTA) 200-200-20 MG/5ML suspension 30 mL  30 mL Oral Q4H PRN Laverle Hobby, PA-C      . cyclobenzaprine (FLEXERIL) tablet 10 mg  10 mg Oral TID PRN Laverle Hobby, PA-C   10 mg at 06/25/14 1022  . escitalopram (LEXAPRO) tablet 10 mg  10 mg Oral Daily Kathlee Nations, MD   10 mg at 06/25/14 0751  . feeding supplement (ENSURE COMPLETE) (ENSURE COMPLETE) liquid 237 mL  237 mL Oral BID BM Toribio Harbour, RD   237 mL at 06/24/14 1001  . hydrOXYzine (ATARAX/VISTARIL) tablet 50 mg  50 mg Oral Q4H PRN Laverle Hobby, PA-C   50 mg at 06/24/14 1001  . lamoTRIgine (LAMICTAL) tablet 25 mg  25 mg Oral BID Kathlee Nations, MD  25 mg at 06/25/14 0751  . magnesium hydroxide (MILK OF MAGNESIA) suspension 30 mL  30 mL Oral Daily PRN Laverle Hobby, PA-C      . nicotine (NICODERM CQ - dosed in mg/24 hours) patch 21 mg  21 mg Transdermal Daily Neita Garnet, MD   21 mg at 06/25/14 0751  . ondansetron (ZOFRAN) tablet 4 mg  4 mg Oral Q8H PRN Laverle Hobby, PA-C   4 mg at 06/22/14 4970  . prazosin (MINIPRESS) capsule 1 mg  1 mg Oral QHS Laverle Hobby, PA-C   1 mg at 06/24/14 2209  .  QUEtiapine (SEROQUEL) tablet 200 mg  200 mg Oral QHS Laverle Hobby, PA-C   200 mg at 06/24/14 2208  . QUEtiapine (SEROQUEL) tablet 50 mg  50 mg Oral BID Neita Garnet, MD   50 mg at 06/25/14 0751  . rOPINIRole (REQUIP) tablet 0.5 mg  0.5 mg Oral TID Laverle Hobby, PA-C   0.5 mg at 06/25/14 1155    Lab Results:  Results for orders placed during the hospital encounter of 06/21/14 (from the past 48 hour(s))  URINALYSIS W MICROSCOPIC     Status: Abnormal   Collection Time    06/24/14 12:03 PM      Result Value Ref Range   Color, Urine YELLOW  YELLOW   APPearance CLEAR  CLEAR   Specific Gravity, Urine 1.005  1.005 - 1.030   pH 7.0  5.0 - 8.0   Glucose, UA NEGATIVE  NEGATIVE mg/dL   Hgb urine dipstick NEGATIVE  NEGATIVE   Bilirubin Urine NEGATIVE  NEGATIVE   Ketones, ur NEGATIVE  NEGATIVE mg/dL   Protein, ur NEGATIVE  NEGATIVE mg/dL   Urobilinogen, UA 0.2  0.0 - 1.0 mg/dL   Nitrite NEGATIVE  NEGATIVE   Leukocytes, UA TRACE (*) NEGATIVE   WBC, UA 3-6  <3 WBC/hpf   RBC / HPF 3-6  <3 RBC/hpf   Bacteria, UA RARE  RARE   Squamous Epithelial / LPF FEW (*) RARE   Comment: Performed at [facility]    Physical Findings: AIMS: Facial and Oral Movements Muscles of Facial Expression: None, normal Lips and Perioral Area: None, normal Jaw: None, normal Tongue: None, normal,Extremity Movements Upper (arms, wrists, hands, fingers): None, normal Lower (legs, knees, ankles, toes): None, normal, Trunk Movements Neck, shoulders, hips: None, normal, Overall Severity Severity of abnormal movements (highest score from questions above): None, normal Incapacitation due to abnormal movements: None, normal Patient's awareness of abnormal movements (rate only patient's report): No Awareness, Dental Status Current problems with teeth and/or dentures?: No Does patient usually wear dentures?: No  CIWA:  CIWA-Ar Total: 4 COWS:  COWS Total Score: 5  Assessment: Patient reports partial  improvement but remains depressed, isolative, somewhat withdrawn. She is not currently suicidal. She is tolerating medications well thus far. As noted, she describes significant cravings for alcohol, which has contributed to  Relapses in the past.  We discussed options and she agrees to [facility] trial. We reviewed side effects and rationale. UA improved . U CX pending.  Treatment Plan Summary: Daily contact with patient to assess and evaluate symptoms and progress in treatment Medication management See below  Plan: Add CAMPRAL 666 mgrs TID , Continue SEROQUEL 50 mgrs QAM and 200 mgrs QHS, Minipress 1 mgr QHS.  Continue inpatient treatment/ group therapies, milieu.  Medical Decision Making Problem Points:  Established problem, stable/improving (1) Data Points:  Review or order clinical lab tests (1)  Review of new medications or change in dosage (2)  I certify that inpatient services furnished can reasonably be expected to improve the patient's condition.   Verlie Hellenbrand 06/25/2014, 2:20 PM

## 2014-06-25 NOTE — Tx Team (Signed)
Interdisciplinary Treatment Plan Update   Date Reviewed:  06/25/2014  Time Reviewed:  8:31 AM  Progress in Treatment:   Attending groups: Yes Participating in groups: Yes Taking medication as prescribed: Yes  Tolerating medication: Yes Family/Significant other contact made:  Yes, collateral contact with mother Patient understands diagnosis: Yes  Discussing patient identified problems/goals with staff: Yes Medical problems stabilized or resolved: Yes Denies suicidal/homicidal ideation: Yes Patient has not harmed self or others: Yes  For review of initial/current patient goals, please see plan of care.  Estimated Length of Stay: 3-4  days  Reasons for Continued Hospitalization:  Anxiety Depression Medication stabilization   New Problems/Goals identified:    Discharge Plan or Barriers:   Home with outpatient follow up scheduled with [facility]  Additional Comments:   Attendees:  Patient:  06/25/2014 8:31 AM   Signature:  Sallyanne HaversF. Cobos, MD 06/25/2014 8:31 AM  Signature:  Lamount Crankerhris Judge, RN 06/25/2014 8:31 AM  Signature: Genelle GatherPatti Dukes, RN 06/25/2014 8:31 AM  Signature: Harold Barbanonecia Byrd, RN 06/25/2014 8:31 AM  Signature:   06/25/2014 8:31 AM  Signature:  Juline PatchQuylle Haisley Arens, LCSW 06/25/2014 8:31 AM  Signature:  06/25/2014 8:31 AM   06/25/2014 8:31 AM   06/25/2014 8:31 AM   06/25/2014  8:31 AM   06/25/2014  8:31 AM   06/25/2014  8:31 AM    Scribe for Treatment Team:   Juline PatchQuylle Imad Shostak,  06/25/2014 8:31 AM

## 2014-06-25 NOTE — [facility] Group Notes (Signed)
[facility] LCSW Group Therapy  Feelings Around Relapse 1:15 -2:30        06/25/2014   Type of Therapy:  Group Therapy  Participation Level:  Appropriate  Participation Quality:  Appropriate  Affect:  Appropriate  Cognitive:  Attentive Appropriate  Insight:  Developing/Improving  Engagement in Therapy: Developing/Improving  Modes of Intervention:  Discussion Exploration Problem-Solving Supportive  Summary of Progress/Problems:  The topic for today was feelings around relapse.    Patient processed feelings toward relapse and was able to relate to peers.  She stated relapse for her would be returning to alcohol use.  She share her family is very concerned about her due to a cousin overdosing on heroin recently.   Patient identified coping skills that can be used to prevent a relapse.   Wynn BankerHodnett, Latrica Clowers Hairston 06/25/2014

## 2014-06-25 NOTE — Progress Notes (Signed)
D: Patient's affect depressed and mood is anxious. She reported on the self inventory sheet that she's sleeping fair, appetite is improving, low energy level and poor ability to pay attention. Patient rates depression and feelings of hopelessness "7". She's actively participating in groups; writer has observed several times that the patient is often in the dayroom conversing, smiling and laughing amongst peers. Patient complies with all scheduled medications.  A: Support and encouragement provided to patient. Scheduled medications administered per MD orders. Maintain Q15 minute checks for safety.  R: Patient receptive. Denies HI and auditory/visual hallucinations. Patient remains safe.

## 2014-06-26 DIAGNOSIS — F4310 Post-traumatic stress disorder, unspecified: Secondary | ICD-10-CM

## 2014-06-26 DIAGNOSIS — F1010 Alcohol abuse, uncomplicated: Secondary | ICD-10-CM

## 2014-06-26 NOTE — Progress Notes (Signed)
[facility] Group Notes:  (Nursing/MHT/Case Management/Adjunct)  Date:  06/26/2014  Time:  12:12 AM  Type of Therapy:  Group Therapy  Participation Level:  Active  Participation Quality:  Appropriate and Redirectable  Affect:  Appropriate and Excited  Cognitive:  Appropriate  Insight:  Improving  Engagement in Group:  Developing/Improving and Off Topic  Modes of Intervention:  Socialization and Support  Summary of Progress/Problems: Pt. Rated her energy level a 10.  Pt. Stated withdraw and "becoming quiet" were early warning signs of relapse. Pt. Stated she was developing coping skills for anxiety.  Sondra ComeWilson, Juaquin Ludington J 06/26/2014, 12:12 AM

## 2014-06-26 NOTE — Progress Notes (Signed)
Psychoeducational Group Note  Date: 06/26/2014 Time:  1015  Group Topic/Focus:  Identifying Needs:   The focus of this group is to help patients identify their personal needs that have been historically problematic and identify healthy behaviors to address their needs.  Participation Level:  Active  Participation Quality:  Appropriate  Affect:  Appropriate  Cognitive:  Oriented  Insight:  Improving  Engagement in Group:  Engaged  Additional Comments:  Pt attended and was involved in the discussions. Added much to the group  Trilby Way A  

## 2014-06-26 NOTE — [facility] Group Notes (Signed)
Adult Psychoeducational Group Note  Date:  06/26/2014 Time:  11:05 PM  Group Topic/Focus:  Wrap-Up Group:   The focus of this group is to help patients review their daily goal of treatment and discuss progress on daily workbooks.  Participation Level:  Active  Participation Quality:  Redirectable, Sharing and Supportive  Affect:  Anxious and Excited  Cognitive:  Alert and Appropriate  Insight: Improving  Engagement in Group:  Monopolizing and Supportive  Modes of Intervention:  Support  Additional Comments:  Pt participated in group.  She was redirected multiple times not to talk when other group members are sharing.  She reported her goal was to have a better day than yesterday.  She listed physical attributes for 3 positive things about herself from the workbook in group.  She reports she is understanding how to express her emotions rather than keep them bottle up as she was raised that way through childhood being in a military family.  Aundria RudWILKINSON, Tom Macpherson L 06/26/2014, 11:05 PM

## 2014-06-26 NOTE — [facility] Group Notes (Signed)
[facility] Group Notes:  (Nursing/MHT/Case Management/Adjunct)  Date:  06/26/2014  Time:  12:01 PM  Type of Therapy:  Psychoeducational Skills  Participation Level:  Active  Participation Quality:  Appropriate  Affect:  Appropriate  Cognitive:  Appropriate  Insight:  Appropriate  Engagement in Group:  Engaged  Modes of Intervention:  Discussion  Summary of Progress/Problems: Pt did attend self inventory group, pt reported that she was negative SI/HI, no AH/VH noted. Pt rated her depression as a 4, and her helplessness/hopelessness as a 0.     Pt reported no issues or concerns.   Jacquelyne BalintForrest, Samy Ryner Shanta 06/26/2014, 12:01 PM

## 2014-06-26 NOTE — Progress Notes (Signed)
[facility] MD Progress Note  06/26/2014 1:58 PM Kayla Silva  MRN:  [id number] Subjective: States she is feeling a lot better and is coming along slowly. However she is requesting medication adjustment with lamictal due to her moods being up and down. Upon discharge she plans to attend [facility] 14 day program, and she is due to leave on Monday. Currently rates her depression 5/10, anxiety 7/10, and hopelessness 4/10. She continues to struggle with high levels of anxiety. She is actively participating in group, and has found them to beneficial "listening to people stories".   Objective: I have discussed case with treatment team and have met with patient. She does state she has gone to some groups and has been visible in day room. Behavior on unit in good control. She is tolerating medications well and denies side effects. She is hoping to go to an inpatient rehab setting after discharge, in order to continue working on early recovery and relapse prevention efforts.  Diagnosis:   Alcohol Abuse, Bipolar, Depressed and Post Traumatic Stress Disorder  Total Time spent with patient: 20 minutes   ADL's: fair  Sleep: good  Appetite:  good  Suicidal Ideation:  Denies  Homicidal Ideation:  Denies  AEB (as evidenced by):  Psychiatric Specialty Exam: Physical Exam   Review of Systems  Constitutional: Negative for fever and chills.  Respiratory: Negative for cough and shortness of breath.   Cardiovascular: Negative for chest pain.  Psychiatric/Behavioral: Positive for depression and substance abuse. Negative for suicidal ideas and hallucinations.    Blood pressure 107/74, pulse 98, temperature 97.9 F (36.6 C), temperature source Axillary, resp. rate 16, height 5' (1.524 m), weight 52.164 kg (115 lb), last menstrual period 06/06/2014, SpO2 98.00%.Body mass index is 22.46 kg/(m^2).  General Appearance: Fairly Groomed  Engineer, water::  Good  Speech:  Normal Rate  Volume:  Normal  Mood:  Euthymic   Affect:  Appropriate and Congruent  Thought Process:  Goal Directed and Linear  Orientation:  Full (Time, Place, and Person)  Thought Content:  no psychotic symptoms  Suicidal Thoughts:  No- currently denies any suicidal ideations  Homicidal Thoughts:  No  Memory:  NA  Judgement:  Fair  Insight:  Fair  Psychomotor Activity:  Normal  Concentration:  Fair  Recall:  Good  Fund of Knowledge:Good  Language: Good  Akathisia:  Negative  Handed:  Right  AIMS (if indicated):     Assets:  Communication Skills Desire for Improvement  Sleep:  Number of Hours: 6.25   Musculoskeletal: Strength & Muscle Tone: within normal limits Gait & Station: normal Patient leans: N/A  Current Medications: Current Facility-Administered Medications  Medication Dose Route Frequency Provider Last Rate Last Dose  . acamprosate (CAMPRAL) tablet 666 mg  666 mg Oral TID WC Neita Garnet, MD   666 mg at 06/26/14 1200  . acetaminophen (TYLENOL) tablet 650 mg  650 mg Oral Q6H PRN Laverle Hobby, PA-C   650 mg at 06/25/14 1022  . alum & mag hydroxide-simeth (MAALOX/MYLANTA) 200-200-20 MG/5ML suspension 30 mL  30 mL Oral Q4H PRN Laverle Hobby, PA-C   30 mL at 06/26/14 1202  . cyclobenzaprine (FLEXERIL) tablet 10 mg  10 mg Oral TID PRN Laverle Hobby, PA-C   10 mg at 06/26/14 5462  . escitalopram (LEXAPRO) tablet 10 mg  10 mg Oral Daily Kathlee Nations, MD   10 mg at 06/26/14 0836  . feeding supplement (ENSURE COMPLETE) (ENSURE COMPLETE) liquid 237 mL  237  mL Oral BID BM Toribio Harbour, RD   237 mL at 06/26/14 1000  . hydrOXYzine (ATARAX/VISTARIL) tablet 50 mg  50 mg Oral Q4H PRN Laverle Hobby, PA-C   50 mg at 06/24/14 1001  . lamoTRIgine (LAMICTAL) tablet 25 mg  25 mg Oral BID Kathlee Nations, MD   25 mg at 06/26/14 0836  . magnesium hydroxide (MILK OF MAGNESIA) suspension 30 mL  30 mL Oral Daily PRN Laverle Hobby, PA-C      . nicotine (NICODERM CQ - dosed in mg/24 hours) patch 21 mg  21 mg Transdermal Daily  Neita Garnet, MD   21 mg at 06/26/14 0834  . ondansetron (ZOFRAN) tablet 4 mg  4 mg Oral Q8H PRN Laverle Hobby, PA-C   4 mg at 06/22/14 2683  . prazosin (MINIPRESS) capsule 1 mg  1 mg Oral QHS Laverle Hobby, PA-C   1 mg at 06/25/14 2139  . QUEtiapine (SEROQUEL) tablet 200 mg  200 mg Oral QHS Laverle Hobby, PA-C   200 mg at 06/25/14 2140  . QUEtiapine (SEROQUEL) tablet 50 mg  50 mg Oral BID Neita Garnet, MD   50 mg at 06/26/14 0841  . rOPINIRole (REQUIP) tablet 0.5 mg  0.5 mg Oral TID Laverle Hobby, PA-C   0.5 mg at 06/26/14 1200    Lab Results:  No results found for this or any previous visit (from the past 48 hour(s)).  Physical Findings: AIMS: Facial and Oral Movements Muscles of Facial Expression: None, normal Lips and Perioral Area: None, normal Jaw: None, normal Tongue: None, normal,Extremity Movements Upper (arms, wrists, hands, fingers): None, normal Lower (legs, knees, ankles, toes): None, normal, Trunk Movements Neck, shoulders, hips: None, normal, Overall Severity Severity of abnormal movements (highest score from questions above): None, normal Incapacitation due to abnormal movements: None, normal Patient's awareness of abnormal movements (rate only patient's report): No Awareness, Dental Status Current problems with teeth and/or dentures?: No Does patient usually wear dentures?: No  CIWA:  CIWA-Ar Total: 4 COWS:  COWS Total Score: 5  Assessment: Patient reports partial improvement but remains depressed, isolative, somewhat withdrawn. She is not currently suicidal. She is tolerating medications well thus far. As noted, she describes significant cravings for alcohol, which has contributed to  Relapses in the past.  We discussed options and she agrees to [facility] trial. We reviewed side effects and rationale. UA improved with increase in fluid intake.  Treatment Plan Summary: Daily contact with patient to assess and evaluate symptoms and progress in  treatment Medication management See below  Plan: Add CAMPRAL 666 mgrs TID , Continue SEROQUEL 50 mgrs QAM and 200 mgrs QHS, Minipress 1 mgr QHS.  Continue inpatient treatment/ group therapies, milieu.  Medical Decision Making Problem Points:  Established problem, stable/improving (1) Data Points:  Review or order clinical lab tests (1) Review of new medications or change in dosage (2)  I certify that inpatient services furnished can reasonably be expected to improve the patient's condition.   Priscille Loveless S FNP-BC 06/26/2014, 1:58 PM

## 2014-06-27 NOTE — Progress Notes (Signed)
D) Pt very sleepy and lethargic today. Falling asleep in group and getting up and leaving. Then going to her room and sleeping much of the day. Rates her depression and hopelessness both at a 4 and denies SI and HI. Not playing jokes on people today and not as inappropriate as yesterday. A) Given support and allowed to sleep in between groups. Provided with a brief 1:1.  R) Pt sleepy, not intrusive nor inappropriate today.

## 2014-06-27 NOTE — Progress Notes (Signed)
[facility] MD Progress Note  06/27/2014 11:08 AM Kayla Silva  MRN:  [id number] Subjective: States she is feeling a lot better and is coming along slowly. Still continues to endorse various mood changes. For example " I will be happy one minute, and then 3 minutes later I will be depressed, and then Im hyper and I am not it just flip flops a lot."  Upon discharge she plans to attend [facility] 14 day program, and she is due to leave on Monday. Currently rates her depression 4/10, anxiety 7/10, and hopelessness 3/10. She continues to struggle with high levels of anxiety. She is actively participating in group, and has found them to beneficial "listening to people stories".   Objective: I have discussed case with treatment team and have met with patient. Behavior on unit in good control. She is tolerating medications well and denies side effects. She is hoping to go to an inpatient rehab setting after discharge, in order to continue working on early recovery and relapse prevention efforts.   Diagnosis:   Alcohol Abuse, Bipolar, Depressed and Post Traumatic Stress Disorder  Total Time spent with patient: 20 minutes   ADL's: fair  Sleep: good  Appetite:  good  Suicidal Ideation:  Denies  Homicidal Ideation:  Denies  AEB (as evidenced by):  Psychiatric Specialty Exam: Physical Exam   Review of Systems  Constitutional: Negative for fever and chills.  Respiratory: Negative for cough and shortness of breath.   Cardiovascular: Negative for chest pain.  Psychiatric/Behavioral: Positive for depression and substance abuse. Negative for suicidal ideas and hallucinations.    Blood pressure 128/77, pulse 96, temperature 97.8 F (36.6 C), temperature source Axillary, resp. rate 16, height 5' (1.524 m), weight 52.164 kg (115 lb), last menstrual period 06/06/2014, SpO2 98.00%.Body mass index is 22.46 kg/(m^2).  General Appearance: Fairly Groomed  Engineer, water::  Good  Speech:  Normal Rate  Volume:  Normal   Mood:  Euthymic  Affect:  Appropriate and Congruent  Thought Process:  Goal Directed and Linear  Orientation:  Full (Time, Place, and Person)  Thought Content:  no psychotic symptoms  Suicidal Thoughts:  No- currently denies any suicidal ideations  Homicidal Thoughts:  No  Memory:  NA  Judgement:  Fair  Insight:  Fair  Psychomotor Activity:  Normal  Concentration:  Fair  Recall:  Good  Fund of Knowledge:Good  Language: Good  Akathisia:  Negative  Handed:  Right  AIMS (if indicated):     Assets:  Communication Skills Desire for Improvement  Sleep:  Number of Hours: 6   Musculoskeletal: Strength & Muscle Tone: within normal limits Gait & Station: normal Patient leans: N/A  Current Medications: Current Facility-Administered Medications  Medication Dose Route Frequency Provider Last Rate Last Dose  . acamprosate (CAMPRAL) tablet 666 mg  666 mg Oral TID WC Neita Garnet, MD   666 mg at 06/27/14 0813  . acetaminophen (TYLENOL) tablet 650 mg  650 mg Oral Q6H PRN Laverle Hobby, PA-C   650 mg at 06/25/14 1022  . alum & mag hydroxide-simeth (MAALOX/MYLANTA) 200-200-20 MG/5ML suspension 30 mL  30 mL Oral Q4H PRN Laverle Hobby, PA-C   30 mL at 06/26/14 1202  . cyclobenzaprine (FLEXERIL) tablet 10 mg  10 mg Oral TID PRN Laverle Hobby, PA-C   10 mg at 06/27/14 0817  . escitalopram (LEXAPRO) tablet 10 mg  10 mg Oral Daily Kathlee Nations, MD   10 mg at 06/27/14 0814  . feeding supplement (  ENSURE COMPLETE) (ENSURE COMPLETE) liquid 237 mL  237 mL Oral BID BM Toribio Harbour, RD   237 mL at 06/26/14 1501  . hydrOXYzine (ATARAX/VISTARIL) tablet 50 mg  50 mg Oral Q4H PRN Laverle Hobby, PA-C   50 mg at 06/26/14 2136  . lamoTRIgine (LAMICTAL) tablet 25 mg  25 mg Oral BID Kathlee Nations, MD   25 mg at 06/27/14 0825  . magnesium hydroxide (MILK OF MAGNESIA) suspension 30 mL  30 mL Oral Daily PRN Laverle Hobby, PA-C      . nicotine (NICODERM CQ - dosed in mg/24 hours) patch 21 mg  21 mg  Transdermal Daily Neita Garnet, MD   21 mg at 06/27/14 0818  . ondansetron (ZOFRAN) tablet 4 mg  4 mg Oral Q8H PRN Laverle Hobby, PA-C   4 mg at 06/22/14 0722  . prazosin (MINIPRESS) capsule 1 mg  1 mg Oral QHS Laverle Hobby, PA-C   1 mg at 06/26/14 2216  . QUEtiapine (SEROQUEL) tablet 200 mg  200 mg Oral QHS Laverle Hobby, PA-C   200 mg at 06/26/14 2215  . QUEtiapine (SEROQUEL) tablet 50 mg  50 mg Oral BID Neita Garnet, MD   50 mg at 06/27/14 5750  . rOPINIRole (REQUIP) tablet 0.5 mg  0.5 mg Oral TID Laverle Hobby, PA-C   0.5 mg at 06/27/14 5183    Lab Results:  No results found for this or any previous visit (from the past 48 hour(s)).  Physical Findings: AIMS: Facial and Oral Movements Muscles of Facial Expression: None, normal Lips and Perioral Area: None, normal Jaw: None, normal Tongue: None, normal,Extremity Movements Upper (arms, wrists, hands, fingers): None, normal Lower (legs, knees, ankles, toes): None, normal, Trunk Movements Neck, shoulders, hips: None, normal, Overall Severity Severity of abnormal movements (highest score from questions above): None, normal Incapacitation due to abnormal movements: None, normal Patient's awareness of abnormal movements (rate only patient's report): No Awareness, Dental Status Current problems with teeth and/or dentures?: No Does patient usually wear dentures?: No  CIWA:  CIWA-Ar Total: 4 COWS:  COWS Total Score: 5  Assessment: Patient reports great improvement.  She is not currently suicidal. She is tolerating medications well thus far.  Treatment Plan Summary: Daily contact with patient to assess and evaluate symptoms and progress in treatment Medication management See below  Plan: continue CAMPRAL 666 mgrs TID , Continue SEROQUEL 50 mgrs QAM and 200 mgrs QHS, Minipress 1 mgr QHS.  Continue inpatient treatment/ group therapies, milieu.  Medical Decision Making Problem Points:  Established problem, stable/improving  (1) Data Points:  Review or order clinical lab tests (1) Review of new medications or change in dosage (2)  I certify that inpatient services furnished can reasonably be expected to improve the patient's condition.   Priscille Loveless S FNP-BC 06/27/2014, 11:08 AM

## 2014-06-27 NOTE — Progress Notes (Signed)
Adult Psychoeducational Group Note  Date:  06/27/2014 Time:  10:14 PM  Group Topic/Focus:  Wrap-Up Group:   The focus of this group is to help patients review their daily goal of treatment and discuss progress on daily workbooks.  Participation Level:  Active  Participation Quality:  Attentive  Affect:  Appropriate  Cognitive:  Appropriate  Insight: Improving  Engagement in Group:  Distracting and Improving  Modes of Intervention:  Clarification, Discussion and Exploration  Additional Comments:    [name], [name] 06/27/2014, 10:14 PM

## 2014-06-27 NOTE — Progress Notes (Signed)
Psychoeducational Group Note  Date: 06/27/2014 Time:  0930  Group Topic/Focus:  Gratefulness:  The focus of this group is to help patients identify what two things they are most grateful for in their lives. What helps ground them and to center them on their work to their recovery.  Participation Level:  Active  Participation Quality:  Appropriate  Affect:  Appropriate  Cognitive:  Oriented  Insight:  Improving  Engagement in Group:  Engaged  Additional Comments:    Kayla Silva  

## 2014-06-27 NOTE — [facility] Group Notes (Signed)
[facility] Group Notes: (Clinical Social Work)   06/27/2014      Type of Therapy:  Group Therapy   Participation Level:  Did Not Attend    Kayla MantleMareida Grossman-Orr, LCSW 06/27/2014, 4:35 PM

## 2014-06-27 NOTE — Progress Notes (Signed)
Patient ID: Kayla Silva, female   DOB: 1976/08/02, 38 y.o.   MRN: [id number] D)  Has been seen out and about on the hall this evening, interactions seem to be focused on a particular female peer.  Has been pleasant, animated, intrusive at times,  While group was in session, environmental checks were done, and the only remote for the hall was found in drawer in her room.  When asked about it, laughed and said it was a practical joke on someone, agreed not to take it again.  Stated her goal was to sleep better, and that she tries to help everyone.   A)  Will continue to offer support and encouragement, [facility] R)  Safety maintained.

## 2014-06-27 NOTE — Progress Notes (Signed)
Psychoeducational Group Note  Date:  06/27/2014 Time:  1015  Group Topic/Focus:  Making Healthy Choices:   The focus of this group is to help patients identify negative/unhealthy choices they were using prior to admission and identify positive/healthier coping strategies to replace them upon discharge.  Participation Level:  Active  Participation Quality:  Appropriate  Affect:  Appropriate  Cognitive:  Oriented  Insight:  Improving  Engagement in Group:  Engaged  Additional Comments:    Mayur Duman A 06/27/2014 

## 2014-06-28 DIAGNOSIS — F3160 Bipolar disorder, current episode mixed, unspecified: Principal | ICD-10-CM

## 2014-06-28 DIAGNOSIS — F1020 Alcohol dependence, uncomplicated: Secondary | ICD-10-CM | POA: Diagnosis present

## 2014-06-28 MED ORDER — QUETIAPINE FUMARATE 200 MG PO TABS: 200.0000 mg | ORAL_TABLET | Freq: Every day | ORAL | Status: DC

## 2014-06-28 MED ORDER — PRAZOSIN HCL 1 MG PO CAPS: 1.0000 mg | ORAL_CAPSULE | Freq: Every day | ORAL | Status: DC

## 2014-06-28 MED ORDER — LAMOTRIGINE 25 MG PO TABS: 25.0000 mg | ORAL_TABLET | Freq: Two times a day (BID) | ORAL | Status: DC

## 2014-06-28 MED ORDER — ROPINIROLE HCL 0.5 MG PO TABS: 0.5000 mg | ORAL_TABLET | Freq: Three times a day (TID) | ORAL | Status: DC

## 2014-06-28 MED ORDER — ESCITALOPRAM OXALATE 10 MG PO TABS: 10.0000 mg | ORAL_TABLET | Freq: Every day | ORAL | Status: DC

## 2014-06-28 MED ORDER — ACAMPROSATE CALCIUM 333 MG PO TBEC: 666.0000 mg | DELAYED_RELEASE_TABLET | Freq: Three times a day (TID) | ORAL | Status: DC

## 2014-06-28 MED ORDER — QUETIAPINE FUMARATE 50 MG PO TABS: 50.0000 mg | ORAL_TABLET | Freq: Two times a day (BID) | ORAL | Status: DC

## 2014-06-28 NOTE — Progress Notes (Signed)
Discharge Note:  [facility]  Denied SI and HI.  Denied A/V hallucinations.  Denied pain.  Suicide prevention information given and discussed with patient who stated she understood and had no questions.  Patient stated she received all her belongings, clothing, toiletries, misc items, prescriptions, medications, belt, wallet, cards, etc.  [facility]

## 2014-06-28 NOTE — [facility] Group Notes (Signed)
[facility]   06/28/2014 12:50 PM    Participation Quality:  Patient did not attend group.    Kayla Silva, Kayla Silva Kayla Silva

## 2014-06-28 NOTE — [facility] Suicide Risk Assessment (Signed)
Suicide Risk Assessment  Discharge Assessment     Demographic Factors:  Caucasian  Total Time spent with patient: 45 minutes  Psychiatric Specialty Exam:     Blood pressure 116/77, pulse 90, temperature 98.1 F (36.7 C), temperature source Axillary, resp. rate 18, height 5' (1.524 m), weight 52.164 kg (115 lb), last menstrual period 06/06/2014, SpO2 98.00%.Body mass index is 22.46 kg/(m^2).  General Appearance: Fairly Groomed  Patent attorneyye Contact::  Fair  Speech:  Clear and Coherent  Volume:  Normal  Mood:  Euthymic  Affect:  Appropriate  Thought Process:  Coherent and Goal Directed  Orientation:  Full (Time, Place, and Person)  Thought Content:  plans as she moves on, relapse prevention plas  Suicidal Thoughts:  No  Homicidal Thoughts:  No  Memory:  Immediate;   Fair Recent;   Fair Remote;   Fair  Judgement:  Fair  Insight:  Present  Psychomotor Activity:  Normal  Concentration:  Fair  Recall:  [employer]Fair  Fund of Knowledge:NA  Language: Fair  Akathisia:  No  Handed:  Right  AIMS (if indicated):     Assets:  Desire for Improvement Housing Social Support Transportation  Sleep:  Number of Hours: 6    Musculoskeletal: Strength & Muscle Tone: within normal limits Gait & Station: normal Patient leans: N/A   Mental Status Per Nursing Assessment::   On Admission:  Suicidal ideation indicated by patient  Current Mental Status by Physician: IN full contact with reality. There are no active S/S of withdrawal. She is [facility] and [facility]   Loss Factors: Decrease in vocational status and Legal issues  Historical Factors: NA  Risk Reduction Factors:   Sense of responsibility to family and Positive social support  Continued Clinical Symptoms:  Bipolar Disorder:   Bipolar II Alcohol/Substance Abuse/Dependencies  Cognitive Features That Contribute To Risk:  Closed-mindedness Polarized thinking Thought constriction (tunnel vision)     Suicide Risk:  Minimal: No identifiable suicidal ideation.  Patients presenting with no risk factors but with morbid ruminations; may be classified as minimal risk based on the severity of the depressive symptoms  Discharge Diagnoses:   AXIS I:  Alcohol Dependence, Bipolar II disorder depressed  AXIS II:  No diagnosis AXIS III:   Past Medical History  Diagnosis Date  . Bipolar 1 disorder   . Depression    AXIS IV:  other psychosocial or environmental problems AXIS V:  61-70 mild symptoms  Plan Of Care/Follow-up recommendations:  Activity:  as tolerated  Diet:  regular Follow up [facility] Is patient on multiple antipsychotic therapies at discharge:  No   Has Patient had three or more failed trials of antipsychotic monotherapy by history:  No  Recommended Plan for Multiple Antipsychotic Therapies: NA    Atha Mcbain A 06/28/2014, 10:52 AM

## 2014-06-28 NOTE — Progress Notes (Signed)
[facility] :  Will you be returning to the same living situation after discharge:No, [facility]. At discharge, do you have transportation home?:Yes,  [facility] to provide transportation. Do you have the ability to pay for your medications:No.  Patient needs assistance with indigent medications   Release of information consent forms completed and in the chart;  Patient's signature needed at discharge.  Patient to Follow up at: Follow-up Information   Follow up with [facility] On 06/28/2014. [facility] will transport patient around 2PM today)    Contact information:   [address] [place], [place]   [id number]  [phone]      Patient denies SI/HI:   Patient no longer endorsing SI/HI or other thoughts of self harm.    Safety Planning and Suicide Prevention discussed:  .Reviewed with all patients during discharge planning group   Clancey Welton, Joesph JulyQuylle Hairston 06/28/2014, 11:45 AM

## 2014-06-28 NOTE — Progress Notes (Signed)
Patient ID: Jefferey PicaHaley M Blauvelt, female   DOB: [birth year], 38 y.o.   MRN: [id number] D)   Has been pleasant and cooperative, and visible on the unit this evening, participating in the milieu, attended group.  States she slept a god bit of the day, trying to get herself ready [facility].  States had wanted something longer term than 14 days,  Had [facility] to [facility] for [facility] day program but her insurance only covered 14 and she had to leave.  States is trying to get the right mindset to succeed this time, realizes it isn't just medications, that she has to make some changes in her life.  States still has mood swings but they seem a little better.  Wants to get on track and stay there. States probable d/c time is around 4 pm. A)  Support and encouragement, will continue to monitor for safety, continue POC R)  Safety maintained.

## 2014-06-28 NOTE — Progress Notes (Signed)
D:  Patient's self inventory sheet, patient has poor sleep, improving appetite, low energy level, poor attention span.  Rated depression and anxiety 5, hopeless 4.  Denied withdrawals.  Denied SI.  In the past 24 hours has experienced L shoulder pain, worst pain 7, zero pain goal.  Plans to discharge to [facility].  No problems taking meds after discharge. A:  Medications administered per MD orders.  Emotional support and encouragement given patient. R:  Denied SI and HI, contracts for safety.  Denied A/V hallucinations.  Safety maintained with 15 minute checks which will continue per MD orders.

## 2014-06-30 NOTE — Progress Notes (Signed)
Patient Discharge Instructions:  After Visit Summary (AVS):   Faxed to:  06/30/14 Psychiatric Admission Assessment Note:   Faxed to:  06/30/14 Suicide Risk Assessment - Discharge Assessment:   Faxed to:  06/30/14 Faxed/Sent to the Next Level Care provider:  06/30/14 [facility] @ [phone]  Jerelene ReddenSheena E [place], 06/30/2014, 4:05 PM
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
## 2019-08-15 ENCOUNTER — Other Ambulatory Visit: Payer: Self-pay

## 2019-08-15 ENCOUNTER — Encounter (HOSPITAL_BASED_OUTPATIENT_CLINIC_OR_DEPARTMENT_OTHER): Payer: Self-pay

## 2019-08-15 ENCOUNTER — Emergency Department (HOSPITAL_BASED_OUTPATIENT_CLINIC_OR_DEPARTMENT_OTHER)
Admission: EM | Admit: 2019-08-15 | Discharge: 2019-08-16 | Disposition: A | Discharge status: Home / Self Care | Payer: Self-pay | Attending: Emergency Medicine | Admitting: Emergency Medicine

## 2019-08-15 DIAGNOSIS — Y929 Unspecified place or not applicable: Secondary | ICD-10-CM | POA: Insufficient documentation

## 2019-08-15 DIAGNOSIS — M25571 Pain in right ankle and joints of right foot: Secondary | ICD-10-CM | POA: Insufficient documentation

## 2019-08-15 DIAGNOSIS — S022XXA Fracture of nasal bones, initial encounter for closed fracture: Secondary | ICD-10-CM | POA: Insufficient documentation

## 2019-08-15 DIAGNOSIS — Z79899 Other long term (current) drug therapy: Secondary | ICD-10-CM | POA: Insufficient documentation

## 2019-08-15 DIAGNOSIS — Z3202 Encounter for pregnancy test, result negative: Secondary | ICD-10-CM | POA: Insufficient documentation

## 2019-08-15 DIAGNOSIS — Y999 Unspecified external cause status: Secondary | ICD-10-CM | POA: Insufficient documentation

## 2019-08-15 DIAGNOSIS — F17210 Nicotine dependence, cigarettes, uncomplicated: Secondary | ICD-10-CM | POA: Insufficient documentation

## 2019-08-15 DIAGNOSIS — Y9389 Activity, other specified: Secondary | ICD-10-CM | POA: Insufficient documentation

## 2019-08-15 NOTE — ED Notes (Signed)
ED Provider at bedside. 
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
## 2019-08-16 ENCOUNTER — Emergency Department (HOSPITAL_BASED_OUTPATIENT_CLINIC_OR_DEPARTMENT_OTHER): Payer: Self-pay

## 2019-08-16 LAB — PREGNANCY, URINE: Preg Test, Ur: NEGATIVE

## 2019-08-16 MED ORDER — ACETAMINOPHEN 500 MG PO TABS
1000.0000 mg | ORAL_TABLET | Freq: Once | ORAL | Status: DC
  Filled 2019-08-16: qty 2

## 2019-08-16 MED ORDER — ONDANSETRON 8 MG PO TBDP
8.0000 mg | ORAL_TABLET | Freq: Once | ORAL | Status: AC
  Administered 2019-08-16: 01:00:00 8 mg via ORAL
  Filled 2019-08-16: qty 1

## 2019-08-16 MED ORDER — KETOROLAC TROMETHAMINE 60 MG/2ML IM SOLN
30.0000 mg | Freq: Once | INTRAMUSCULAR | Status: AC
  Administered 2019-08-16: 30 mg via INTRAMUSCULAR
  Filled 2019-08-16: qty 2

## 2019-08-16 MED ORDER — NAPROXEN 375 MG PO TABS: 375.0000 mg | ORAL_TABLET | Freq: Two times a day (BID) | ORAL | 0 refills | Status: DC

## 2019-08-16 NOTE — ED Notes (Signed)
Provided discharge education/instructions and provided opportunity to answer questions, pt agreeable to plan of care. Left at this time with all belongings.

## 2019-08-16 NOTE — ED Notes (Signed)
Pt called out for PO fluids, also reporting nausea. Informed patient that she cannot have anything by mouth until the results on her scans come back. Pt upset with this answer, states "nobody gives a shit about me." Explained to patient that the doctor is aware of her requests for fluids and pain meds. Explained that staff care about her, but we have to take precautions in case of need for surgical intervention. Palumbo MD made aware of nausea. See new orders.

## 2019-08-16 NOTE — ED Notes (Signed)
Patient transported to CT 

## 2019-08-16 NOTE — ED Notes (Signed)
X-ray at bedside

## 2019-08-16 NOTE — ED Notes (Signed)
Per Loma Sousa in [place] - pt requesting IV fluids because she feels she is dehydrated. Per report, pt states she has been vomiting all day. Pt states "I don't feel like they're doing enough for me." Per report, provider was already made aware of this request.
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
## 2019-08-16 NOTE — ED Notes (Signed)
Received report

## 2019-08-16 NOTE — ED Notes (Signed)
Pt reports she has been taking tylenol and ibuprofen all day with no relief. EDP informed. No new orders at this time. Pt cursing upon leaving room.

## 2019-09-15 ENCOUNTER — Other Ambulatory Visit: Payer: Self-pay

## 2019-09-15 ENCOUNTER — Encounter (HOSPITAL_COMMUNITY): Payer: Self-pay | Admitting: Emergency Medicine

## 2019-09-15 ENCOUNTER — Inpatient Hospital Stay (HOSPITAL_COMMUNITY)
Admission: RE | Admit: 2019-09-15 | Discharge: 2019-09-18 | DRG: 885 | Disposition: A | Discharge status: Home / Self Care | Payer: No Typology Code available for payment source | Attending: Psychiatry | Admitting: Psychiatry

## 2019-09-15 DIAGNOSIS — Z91128 Patient's intentional underdosing of medication regimen for other reason: Secondary | ICD-10-CM | POA: Diagnosis not present

## 2019-09-15 DIAGNOSIS — Z888 Allergy status to other drugs, medicaments and biological substances status: Secondary | ICD-10-CM

## 2019-09-15 DIAGNOSIS — I10 Essential (primary) hypertension: Secondary | ICD-10-CM | POA: Diagnosis present

## 2019-09-15 DIAGNOSIS — G2581 Restless legs syndrome: Secondary | ICD-10-CM | POA: Diagnosis present

## 2019-09-15 DIAGNOSIS — F17210 Nicotine dependence, cigarettes, uncomplicated: Secondary | ICD-10-CM | POA: Diagnosis present

## 2019-09-15 DIAGNOSIS — T43216A Underdosing of selective serotonin and norepinephrine reuptake inhibitors, initial encounter: Secondary | ICD-10-CM | POA: Diagnosis present

## 2019-09-15 DIAGNOSIS — F4310 Post-traumatic stress disorder, unspecified: Secondary | ICD-10-CM | POA: Diagnosis present

## 2019-09-15 DIAGNOSIS — Z91410 Personal history of adult physical and sexual abuse: Secondary | ICD-10-CM

## 2019-09-15 DIAGNOSIS — F418 Other specified anxiety disorders: Secondary | ICD-10-CM | POA: Diagnosis present

## 2019-09-15 DIAGNOSIS — R45851 Suicidal ideations: Secondary | ICD-10-CM | POA: Diagnosis present

## 2019-09-15 DIAGNOSIS — Z20828 Contact with and (suspected) exposure to other viral communicable diseases: Secondary | ICD-10-CM | POA: Diagnosis present

## 2019-09-15 DIAGNOSIS — F12988 Cannabis use, unspecified with other cannabis-induced disorder: Secondary | ICD-10-CM | POA: Diagnosis present

## 2019-09-15 DIAGNOSIS — T43596A Underdosing of other antipsychotics and neuroleptics, initial encounter: Secondary | ICD-10-CM | POA: Diagnosis present

## 2019-09-15 DIAGNOSIS — Z818 Family history of other mental and behavioral disorders: Secondary | ICD-10-CM

## 2019-09-15 DIAGNOSIS — F1594 Other stimulant use, unspecified with stimulant-induced mood disorder: Secondary | ICD-10-CM | POA: Diagnosis present

## 2019-09-15 DIAGNOSIS — F332 Major depressive disorder, recurrent severe without psychotic features: Secondary | ICD-10-CM | POA: Diagnosis present

## 2019-09-15 DIAGNOSIS — F3181 Bipolar II disorder: Secondary | ICD-10-CM

## 2019-09-15 DIAGNOSIS — F603 Borderline personality disorder: Secondary | ICD-10-CM | POA: Diagnosis present

## 2019-09-15 LAB — LIPID PANEL
Cholesterol: 195 mg/dL (ref 0–200)
HDL: 39 mg/dL — ABNORMAL LOW (ref >40)
LDL Cholesterol: 130 mg/dL — ABNORMAL HIGH (ref 0–99)
Total CHOL/HDL Ratio: 5 RATIO
Triglycerides: 131 mg/dL (ref <150)
VLDL: 26 mg/dL (ref 0–40)

## 2019-09-15 LAB — RAPID URINE DRUG SCREEN, HOSP PERFORMED
Amphetamines: POSITIVE — AB
Barbiturates: NOT DETECTED
Benzodiazepines: POSITIVE — AB
Cocaine: NOT DETECTED
Opiates: NOT DETECTED
Tetrahydrocannabinol: POSITIVE — AB

## 2019-09-15 LAB — CBC
HCT: 41.3 % (ref 36.0–46.0)
Hemoglobin: 12.9 g/dL (ref 12.0–15.0)
MCH: 28 pg (ref 26.0–34.0)
MCHC: 31.2 g/dL (ref 30.0–36.0)
MCV: 89.6 fL (ref 80.0–100.0)
Platelets: 364 10*3/uL (ref 150–400)
RBC: 4.61 MIL/uL (ref 3.87–5.11)
RDW: 14.2 % (ref 11.5–15.5)
WBC: 9.4 10*3/uL (ref 4.0–10.5)
nRBC: 0 % (ref 0.0–0.2)

## 2019-09-15 LAB — COMPREHENSIVE METABOLIC PANEL
ALT: 10 U/L (ref 0–44)
AST: 14 U/L — ABNORMAL LOW (ref 15–41)
Albumin: 3.9 g/dL (ref 3.5–5.0)
Alkaline Phosphatase: 74 U/L (ref 38–126)
Anion gap: 9 (ref 5–15)
BUN: 6 mg/dL (ref 6–20)
CO2: 26 mmol/L (ref 22–32)
Calcium: 9.4 mg/dL (ref 8.9–10.3)
Chloride: 103 mmol/L (ref 98–111)
Creatinine, Ser: 0.75 mg/dL (ref 0.44–1.00)
GFR calc Af Amer: >60 mL/min (ref >60)
GFR calc non Af Amer: >60 mL/min (ref >60)
Glucose, Bld: 100 mg/dL — ABNORMAL HIGH (ref 70–99)
Potassium: 4.3 mmol/L (ref 3.5–5.1)
Sodium: 138 mmol/L (ref 135–145)
Total Bilirubin: 0.4 mg/dL (ref 0.3–1.2)
Total Protein: 7.4 g/dL (ref 6.5–8.1)

## 2019-09-15 LAB — PREGNANCY, URINE: Preg Test, Ur: NEGATIVE

## 2019-09-15 LAB — SARS CORONAVIRUS 2 BY RT PCR (HOSPITAL ORDER, PERFORMED IN ~~LOC~~ HOSPITAL LAB): SARS Coronavirus 2: NEGATIVE

## 2019-09-15 MED ORDER — SERTRALINE HCL 100 MG PO TABS
100.0000 mg | ORAL_TABLET | Freq: Every day | ORAL | Status: DC
  Administered 2019-09-16: 08:00:00 100 mg via ORAL
  Filled 2019-09-15 (×3): qty 1

## 2019-09-15 MED ORDER — HYDROXYZINE HCL 25 MG PO TABS
25.0000 mg | ORAL_TABLET | Freq: Three times a day (TID) | ORAL | Status: DC | PRN
  Administered 2019-09-15 – 2019-09-17 (×2): 25 mg via ORAL
  Filled 2019-09-15 (×2): qty 1

## 2019-09-15 MED ORDER — CLONAZEPAM 1 MG PO TABS
1.0000 mg | ORAL_TABLET | Freq: Three times a day (TID) | ORAL | Status: DC
  Administered 2019-09-15 – 2019-09-16 (×2): 1 mg via ORAL
  Filled 2019-09-15 (×2): qty 1

## 2019-09-15 MED ORDER — NICOTINE 14 MG/24HR TD PT24
14.0000 mg | MEDICATED_PATCH | Freq: Every day | TRANSDERMAL | Status: DC
  Administered 2019-09-16 – 2019-09-18 (×3): 14 mg via TRANSDERMAL
  Filled 2019-09-15 (×7): qty 1

## 2019-09-15 MED ORDER — LORAZEPAM 1 MG PO TABS: 1.0000 mg | ORAL_TABLET | ORAL | Status: DC | PRN

## 2019-09-15 MED ORDER — MAGNESIUM HYDROXIDE 400 MG/5ML PO SUSP: 30.0000 mL | Freq: Every day | ORAL | Status: DC | PRN

## 2019-09-15 MED ORDER — ACETAMINOPHEN 325 MG PO TABS: 650.0000 mg | ORAL_TABLET | Freq: Four times a day (QID) | ORAL | Status: DC | PRN

## 2019-09-15 MED ORDER — ALUM & MAG HYDROXIDE-SIMETH 200-200-20 MG/5ML PO SUSP: 30.0000 mL | ORAL | Status: DC | PRN

## 2019-09-15 MED ORDER — ROPINIROLE HCL 0.25 MG PO TABS
0.2500 mg | ORAL_TABLET | Freq: Every day | ORAL | Status: DC
  Administered 2019-09-15 – 2019-09-17 (×3): 0.25 mg via ORAL
  Filled 2019-09-15 (×8): qty 1

## 2019-09-15 MED ORDER — OLANZAPINE 10 MG PO TBDP
10.0000 mg | ORAL_TABLET | Freq: Three times a day (TID) | ORAL | Status: DC | PRN
  Administered 2019-09-15 – 2019-09-17 (×3): 10 mg via ORAL
  Filled 2019-09-15 (×4): qty 1

## 2019-09-15 MED ORDER — OXCARBAZEPINE 150 MG PO TABS
150.0000 mg | ORAL_TABLET | Freq: Two times a day (BID) | ORAL | Status: DC
  Administered 2019-09-16: 150 mg via ORAL
  Filled 2019-09-15 (×4): qty 1

## 2019-09-15 MED ORDER — PREGABALIN 75 MG PO CAPS
150.0000 mg | ORAL_CAPSULE | Freq: Two times a day (BID) | ORAL | Status: DC
  Administered 2019-09-16: 08:00:00 150 mg via ORAL
  Filled 2019-09-15: qty 2

## 2019-09-15 MED ORDER — DOXAZOSIN MESYLATE 2 MG PO TABS
2.0000 mg | ORAL_TABLET | Freq: Every day | ORAL | Status: DC
  Administered 2019-09-15: 2 mg via ORAL
  Filled 2019-09-15 (×2): qty 1

## 2019-09-15 MED ORDER — ZIPRASIDONE MESYLATE 20 MG IM SOLR: 20.0000 mg | INTRAMUSCULAR | Status: DC | PRN
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
## 2019-09-15 NOTE — Tx Team (Signed)
Initial Treatment Plan 09/15/2019 10:14 AM Eppie Gibson WKG:[id number]    PATIENT STRESSORS: Medication change or noncompliance Traumatic event   PATIENT STRENGTHS: Ability for insight Capable of independent living Communication skills Motivation for treatment/growth Physical Health Supportive family/friends   PATIENT IDENTIFIED PROBLEMS: Suicidal thoughts  Plan to overdose  anxiety  depression               DISCHARGE CRITERIA:  Ability to meet basic life and health needs Improved stabilization in mood, thinking, and/or behavior Medical problems require only outpatient monitoring Motivation to continue treatment in a less acute level of care Need for constant or close observation no longer present  PRELIMINARY DISCHARGE PLAN: Outpatient therapy Return to previous living arrangement  PATIENT/FAMILY INVOLVEMENT: This treatment plan has been presented to and reviewed with the patient, CRISTIANNA CYR.  The patient and family have been given the opportunity to ask questions and make suggestions.  Baron Sane, RN 09/15/2019, 10:14 AM
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
## 2019-09-15 NOTE — H&P (Signed)
Behavioral Health Medical Screening Exam  Kayla Silva is an 43 y.o. female.  Total Time spent with patient: 20 minutes  Psychiatric Specialty Exam: Physical Exam  Constitutional: She is oriented to person, place, and time. She appears well-developed and well-nourished. No distress.  Eyes: Pupils are equal, round, and reactive to light. Right eye exhibits no discharge. Left eye exhibits no discharge.  Respiratory: Effort normal. No respiratory distress.  Musculoskeletal: Normal range of motion.  Neurological: She is alert and oriented to person, place, and time.  Skin: She is not diaphoretic.  Psychiatric: Her mood appears anxious. She is not withdrawn and not actively hallucinating. Thought content is not paranoid and not delusional. She exhibits a depressed mood. She expresses suicidal ideation. She expresses no homicidal ideation. She expresses suicidal plans.    Review of Systems  Constitutional: Negative for chills, diaphoresis, fever, malaise/fatigue and weight loss.  Respiratory: Negative for cough and shortness of breath.   Cardiovascular: Negative for chest pain.  Gastrointestinal: Negative for diarrhea, nausea and vomiting.  Psychiatric/Behavioral: Positive for depression and suicidal ideas. Negative for hallucinations, memory loss and substance abuse. The patient is nervous/anxious and has insomnia.   All other systems reviewed and are negative.   There were no vitals taken for this visit.There is no height or weight on file to calculate BMI.  General Appearance: Casual  Eye Contact:  Poor  Speech:  Clear and Coherent and Slow  Volume:  Decreased  Mood:  Anxious, Depressed, Hopeless and Worthless  Affect:  Congruent and Depressed  Thought Process:  Coherent and Descriptions of Associations: Intact  Orientation:  Full (Time, Place, and Person)  Thought Content:  Logical and Hallucinations: None  Suicidal Thoughts:  Yes.  with intent/plan  Homicidal Thoughts:  No   Memory:  Immediate;   Fair Recent;   Fair  Judgement:  Impaired  Insight:  Lacking  Psychomotor Activity:  Psychomotor Retardation  Concentration: Concentration: Fair and Attention Span: Fair  Recall:  [employer] of Knowledge:Good  Language: Good  Akathisia:  Negative  Handed:  Right  AIMS (if indicated):     Assets:  Communication Skills Desire for Improvement Housing Intimacy Leisure Time Physical Health  Sleep:       Musculoskeletal: Strength & Muscle Tone: within normal limits Gait & Station: normal Patient leans: N/A  There were no vitals taken for this visit.  Recommendations:  Based on my evaluation the patient does not appear to have an emergency medical condition.  Rozetta Nunnery, NP 09/15/2019, 6:38 AM
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
## 2019-09-16 LAB — TSH: TSH: 0.986 u[IU]/mL (ref 0.350–4.500)

## 2019-09-16 MED ORDER — PRAZOSIN HCL 2 MG PO CAPS
4.0000 mg | ORAL_CAPSULE | Freq: Every day | ORAL | Status: DC
  Administered 2019-09-16 – 2019-09-17 (×2): 4 mg via ORAL
  Filled 2019-09-16 (×2): qty 14
  Filled 2019-09-16 (×3): qty 2

## 2019-09-16 MED ORDER — QUETIAPINE FUMARATE 200 MG PO TABS
200.0000 mg | ORAL_TABLET | Freq: Every day | ORAL | Status: DC
  Administered 2019-09-16: 200 mg via ORAL
  Filled 2019-09-16 (×3): qty 1

## 2019-09-16 MED ORDER — PREGABALIN 100 MG PO CAPS
100.0000 mg | ORAL_CAPSULE | Freq: Three times a day (TID) | ORAL | Status: DC
  Administered 2019-09-16 – 2019-09-17 (×3): 100 mg via ORAL
  Filled 2019-09-16 (×3): qty 1

## 2019-09-16 MED ORDER — QUETIAPINE FUMARATE 50 MG PO TABS
50.0000 mg | ORAL_TABLET | Freq: Two times a day (BID) | ORAL | Status: DC
  Administered 2019-09-16 – 2019-09-17 (×2): 50 mg via ORAL
  Filled 2019-09-16 (×4): qty 1

## 2019-09-16 MED ORDER — FLUOXETINE HCL 20 MG PO CAPS
20.0000 mg | ORAL_CAPSULE | Freq: Every day | ORAL | Status: DC
  Administered 2019-09-17 – 2019-09-18 (×2): 20 mg via ORAL
  Filled 2019-09-16: qty 1
  Filled 2019-09-16: qty 7
  Filled 2019-09-16: qty 1
  Filled 2019-09-16: qty 7
  Filled 2019-09-16: qty 1

## 2019-09-16 MED ORDER — CLONAZEPAM 0.5 MG PO TABS
0.5000 mg | ORAL_TABLET | Freq: Three times a day (TID) | ORAL | Status: DC
  Administered 2019-09-16 – 2019-09-17 (×3): 0.5 mg via ORAL
  Filled 2019-09-16 (×3): qty 1
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
## 2019-09-16 NOTE — Progress Notes (Signed)
D: Pt up on the unit +ve HI/ AVH. Pt stated she was having issue with her roommate.   A: Pt was offered support and encouragement. Pt was given scheduled medications. Pt was encourage to attend groups. Q 15 minute checks were done for safety.   R:.Pt receptive to treatment and safety maintained on unit.

## 2019-09-16 NOTE — Progress Notes (Signed)
Adult Psychoeducational Group Note  Date:  09/16/2019 Time:  8:48 PM  Group Topic/Focus:  Wrap-Up Group:   The focus of this group is to help patients review their daily goal of treatment and discuss progress on daily workbooks.  Participation Level:  Active  Participation Quality:  Appropriate  Affect:  Appropriate  Cognitive:  Oriented  Insight: Improving  Engagement in Group:  Engaged  Modes of Intervention:  Education and Support  Additional Comments:  Patient attended and participated in group tonight.  Salley [facility] 09/16/2019, 8:48 PM
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
## 2019-09-16 NOTE — Progress Notes (Signed)
D: Pt denies SI/HI/AVH. Pt is pleasant and cooperative. Pt very emotional on the unit. Pt stated she was very anxious this evening.  A: Pt was offered support and encouragement. Pt was given scheduled medications. Pt was encourage to attend groups. Q 15 minute checks were done for safety.  R:Pt attends groups and interacts well with peers and staff. Pt is taking medication. Pt has no complaints.Pt receptive to treatment and safety maintained on unit.

## 2019-09-16 NOTE — Progress Notes (Addendum)
Recreation Therapy Notes    Date: 9.23.20 Time: 1000 Location: [address] Day Room  Group Topic: Anger Management  Goal Area(s) Addresses:  Patient will identify triggers for anger.  Patient will identify a situation that makes them angry.  Patient will identify what other emotions comes with anger.   Behavioral Response:  Minimal  Intervention: Worksheet  Activity: Intro to Anger Management.  Patients were to identify any person, situation or topic leads to their anger.  Patients then identified what actions they express when angry, problems they have encountered because of anger and identify 5 positive coping skills they could use when angry.  Education: Anger Management, Discharge Planning   Education Outcome: Acknowledges education/In group clarification offered/Needs additional education.   Clinical Observations/Feedback: Patient stated your reaction to anger is usually the problem.  Pt sat in group for awhile then left.  Pt didn't fill out the worksheet but wrote her coping skills on the back.  Pt identified meditation, listening to music, coloring and communication as coping skills.   Victorino Sparrow, LRT/CTRS     Victorino Sparrow A 09/16/2019 10:34 AM

## 2019-09-16 NOTE — Plan of Care (Signed)
  Problem: Safety: Goal: Ability to remain free from injury will improve Outcome: Progressing   

## 2019-09-16 NOTE — Progress Notes (Signed)
Patient ID: Kayla Silva, female   DOB: [birth year], 43 y.o.   MRN: [id number]   [place] NOVEL CORONAVIRUS (COVID-19) DAILY CHECK-OFF SYMPTOMS - answer yes or no to each - every day NO YES  Have you had a fever in the past 24 hours?  . Fever (Temp > 37.80C / 100F) X   Have you had any of these symptoms in the past 24 hours? . New Cough .  Sore Throat  .  Shortness of Breath .  Difficulty Breathing .  Unexplained Body Aches   X   Have you had any one of these symptoms in the past 24 hours not related to allergies?   . Runny Nose .  Nasal Congestion .  Sneezing   X   If you have had runny nose, nasal congestion, sneezing in the past 24 hours, has it worsened?  X   EXPOSURES - check yes or no X   Have you traveled outside the state in the past 14 days?  X   Have you been in contact with someone with a confirmed diagnosis of COVID-19 or PUI in the past 14 days without wearing appropriate PPE?  X   Have you been living in the same home as a person with confirmed diagnosis of COVID-19 or a PUI (household contact)?    X   Have you been diagnosed with COVID-19?    X              What to do next: Answered NO to all: Answered YES to anything:   Proceed with unit schedule Follow the [facility] Inpatient Flowsheet.

## 2019-09-16 NOTE — Tx Team (Signed)
Interdisciplinary Treatment and Diagnostic Plan Update  09/16/2019 Time of Session: 10:10am Kayla Silva MRN: [id number]  Principal Diagnosis: <principal problem not specified>  Secondary Diagnoses: Active Problems:   Severe recurrent major depression without psychotic features (HCC)   Current Medications:  Current Facility-Administered Medications  Medication Dose Route Frequency Provider Last Rate Last Dose  . acetaminophen (TYLENOL) tablet 650 mg  650 mg Oral Q6H PRN Jackelyn Poling, NP      . alum & mag hydroxide-simeth (MAALOX/MYLANTA) 200-200-20 MG/5ML suspension 30 mL  30 mL Oral Q4H PRN Jackelyn Poling, NP      . clonazePAM Scarlette Calico) tablet 0.5 mg  0.5 mg Oral TID Malvin Johns, MD      . Melene Muller ON 09/17/2019] FLUoxetine (PROZAC) capsule 20 mg  20 mg Oral Daily Malvin Johns, MD      . hydrOXYzine (ATARAX/VISTARIL) tablet 25 mg  25 mg Oral TID PRN Jackelyn Poling, NP   25 mg at 09/15/19 2120  . magnesium hydroxide (MILK OF MAGNESIA) suspension 30 mL  30 mL Oral Daily PRN Nira Conn A, NP      . nicotine (NICODERM CQ - dosed in mg/24 hours) patch 14 mg  14 mg Transdermal Daily Antonieta Pert, MD   14 mg at 09/16/19 0756  . OLANZapine zydis (ZYPREXA) disintegrating tablet 10 mg  10 mg Oral Q8H PRN Antonieta Pert, MD   10 mg at 09/16/19 2330   And  . ziprasidone (GEODON) injection 20 mg  20 mg Intramuscular PRN Antonieta Pert, MD      . prazosin (MINIPRESS) capsule 4 mg  4 mg Oral QHS Malvin Johns, MD      . pregabalin (LYRICA) capsule 100 mg  100 mg Oral TID Malvin Johns, MD      . QUEtiapine (SEROQUEL) tablet 200 mg  200 mg Oral QHS Malvin Johns, MD      . QUEtiapine (SEROQUEL) tablet 50 mg  50 mg Oral BID Malvin Johns, MD      . rOPINIRole (REQUIP) tablet 0.25 mg  0.25 mg Oral QHS Antonieta Pert, MD   0.25 mg at 09/15/19 2121   PTA Medications: Medications Prior to Admission  Medication Sig Dispense Refill Last Dose  . clonazePAM (KLONOPIN) 1 MG tablet Take 1  mg by mouth 2 (two) times daily.     . DULoxetine (CYMBALTA) 30 MG capsule Take 30 mg by mouth daily.     Marland Kitchen OLANZapine (ZYPREXA) 5 MG tablet Take 5 mg by mouth at bedtime.     Marland Kitchen acamprosate (CAMPRAL) 333 MG tablet Take 2 tablets (666 mg total) by mouth 3 (three) times daily with meals. (Patient not taking: Reported on 09/15/2019) 180 tablet 0 Not Taking at Unknown time  . escitalopram (LEXAPRO) 10 MG tablet Take 1 tablet (10 mg total) by mouth daily. (Patient not taking: Reported on 09/15/2019) 30 tablet 0 Not Taking at Unknown time  . lamoTRIgine (LAMICTAL) 25 MG tablet Take 1 tablet (25 mg total) by mouth 2 (two) times daily. (Patient not taking: Reported on 09/15/2019) 60 tablet 0 Not Taking at Unknown time  . naproxen (NAPROSYN) 375 MG tablet Take 1 tablet (375 mg total) by mouth 2 (two) times daily with a meal. (Patient not taking: Reported on 09/15/2019) 10 tablet 0 Not Taking at Unknown time  . prazosin (MINIPRESS) 1 MG capsule Take 1 capsule (1 mg total) by mouth at bedtime. (Patient not taking: Reported on 09/15/2019) 14 capsule 0 Not Taking  at Unknown time  . QUEtiapine (SEROQUEL) 200 MG tablet Take 1 tablet (200 mg total) by mouth at bedtime. (Patient not taking: Reported on 09/15/2019) 30 tablet 0 Not Taking at Unknown time  . QUEtiapine (SEROQUEL) 50 MG tablet Take 1 tablet (50 mg total) by mouth 2 (two) times daily. (Patient not taking: Reported on 09/15/2019) 60 tablet 0 Not Taking at Unknown time  . rOPINIRole (REQUIP) 0.5 MG tablet Take 1 tablet (0.5 mg total) by mouth 3 (three) times daily. (Patient not taking: Reported on 09/15/2019) 90 tablet 0 Not Taking at Unknown time    Patient Stressors: Medication change or noncompliance Traumatic event  Patient Strengths: Ability for insight Capable of independent living Communication skills Motivation for treatment/growth Physical Health Supportive family/friends  Treatment Modalities: Medication Management, Group therapy, Case management,   1 to 1 session with clinician, Psychoeducation, Recreational therapy.   Physician Treatment Plan for Primary Diagnosis: <principal problem not specified> Long Term Goal(s): Improvement in symptoms so as ready for discharge Improvement in symptoms so as ready for discharge   Short Term Goals: Ability to identify changes in lifestyle to reduce recurrence of condition will improve Ability to verbalize feelings will improve Ability to disclose and discuss suicidal ideas Ability to demonstrate self-control will improve Ability to identify and develop effective coping behaviors will improve Ability to maintain clinical measurements within normal limits will improve Compliance with prescribed medications will improve Ability to identify triggers associated with substance abuse/mental health issues will improve Ability to identify changes in lifestyle to reduce recurrence of condition will improve Ability to verbalize feelings will improve Ability to disclose and discuss suicidal ideas Ability to demonstrate self-control will improve Ability to identify and develop effective coping behaviors will improve Ability to maintain clinical measurements within normal limits will improve Compliance with prescribed medications will improve Ability to identify triggers associated with substance abuse/mental health issues will improve  Medication Management: Evaluate patient's response, side effects, and tolerance of medication regimen.  Therapeutic Interventions: 1 to 1 sessions, Unit Group sessions and Medication administration.  Evaluation of Outcomes: Progressing  Physician Treatment Plan for Secondary Diagnosis: Active Problems:   Severe recurrent major depression without psychotic features (HCC)  Long Term Goal(s): Improvement in symptoms so as ready for discharge Improvement in symptoms so as ready for discharge   Short Term Goals: Ability to identify changes in lifestyle to reduce  recurrence of condition will improve Ability to verbalize feelings will improve Ability to disclose and discuss suicidal ideas Ability to demonstrate self-control will improve Ability to identify and develop effective coping behaviors will improve Ability to maintain clinical measurements within normal limits will improve Compliance with prescribed medications will improve Ability to identify triggers associated with substance abuse/mental health issues will improve Ability to identify changes in lifestyle to reduce recurrence of condition will improve Ability to verbalize feelings will improve Ability to disclose and discuss suicidal ideas Ability to demonstrate self-control will improve Ability to identify and develop effective coping behaviors will improve Ability to maintain clinical measurements within normal limits will improve Compliance with prescribed medications will improve Ability to identify triggers associated with substance abuse/mental health issues will improve     Medication Management: Evaluate patient's response, side effects, and tolerance of medication regimen.  Therapeutic Interventions: 1 to 1 sessions, Unit Group sessions and Medication administration.  Evaluation of Outcomes: Progressing   RN Treatment Plan for Primary Diagnosis: <principal problem not specified> Long Term Goal(s): Knowledge of disease and therapeutic regimen to maintain health  will improve  Short Term Goals: Ability to participate in decision making will improve, Ability to verbalize feelings will improve, Ability to disclose and discuss suicidal ideas, Ability to identify and develop effective coping behaviors will improve and Compliance with prescribed medications will improve  Medication Management: RN will administer medications as ordered by provider, will assess and evaluate patient's response and provide education to patient for prescribed medication. RN will report any adverse and/or  side effects to prescribing provider.  Therapeutic Interventions: 1 on 1 counseling sessions, Psychoeducation, Medication administration, Evaluate responses to treatment, Monitor vital signs and CBGs as ordered, Perform/monitor CIWA, COWS, AIMS and Fall Risk screenings as ordered, Perform wound care treatments as ordered.  Evaluation of Outcomes: Progressing   LCSW Treatment Plan for Primary Diagnosis: <principal problem not specified> Long Term Goal(s): Safe transition to appropriate next level of care at discharge, Engage patient in therapeutic group addressing interpersonal concerns.  Short Term Goals: Engage patient in aftercare planning with referrals and resources and Increase skills for wellness and recovery  Therapeutic Interventions: Assess for all discharge needs, 1 to 1 time with Social worker, Explore available resources and support systems, Assess for adequacy in community support network, Educate family and significant other(s) on suicide prevention, Complete Psychosocial Assessment, Interpersonal group therapy.  Evaluation of Outcomes: Progressing   Progress in Treatment: Attending groups: Yes. Participating in groups: Yes. Taking medication as prescribed: Yes. Toleration medication: Yes. Family/Significant other contact made: No, will contact:  pt's significant other Patient understands diagnosis: No. Discussing patient identified problems/goals with staff: Yes. Medical problems stabilized or resolved: Yes. Denies suicidal/homicidal ideation: Yes. Issues/concerns per patient self-inventory: No. Other:   New problem(s) identified: No, Describe:  None  New Short Term/Long Term Goal(s): Medication stabilization, elimination of SI thoughts, and development of a comprehensive mental wellness plan.   Patient Goals:  "Medication management"  Discharge Plan or Barriers: CSW will continue to follow up for appropriate referrals and possible discharge planning  Reason for  Continuation of Hospitalization: Depression Hallucinations Medication stabilization  Estimated Length of Stay: 2-3 days   Attendees: Patient: Kayla Silva  09/16/2019   Physician: Dr. Johnn Hai, MD 09/16/2019   Nursing: Elberta Fortis, RN 09/16/2019   RN Care Manager: 09/16/2019   Social Worker: Ardelle Anton, LCSW 09/16/2019   Recreational Therapist:  09/16/2019   MSW Intern: Ovidio Kin 09/16/2019   Other:  09/16/2019   Other: 09/16/2019      Scribe for Treatment Team: Trecia Rogers, LCSW 09/16/2019 12:38 PM

## 2019-09-16 NOTE — Progress Notes (Signed)
Adult Psychoeducational Group Note  Date:  09/16/2019 Time:  2:05 AM  Group Topic/Focus:  Wrap-Up Group:   The focus of this group is to help patients review their daily goal of treatment and discuss progress on daily workbooks.  Participation Level:  Active  Participation Quality:  Appropriate  Affect:  Appropriate  Cognitive:  Appropriate  Insight: Appropriate  Engagement in Group:  Developing/Improving  Modes of Intervention:  Discussion  Additional Comments: Pt stated her goal for today was to focus on her treatment plan. Pt stated she felt she accomplished her goal today. Pt stated her relationship with her family has improved since she was admitted here. Pt stated her boyfriend coming to visitation tonight help improve her day.  Pt stated she felt better about herself today. Pt rated her overall day an 4 out of 10.. Pt stated her appetite was poor today. Pt stated her goal for tonight is to get some rest. Pt did complain of pain in her right ear. Pt's nurse was made aware of the situation. Pt stated she was not hearing or seeing anything that was not there. Pt stated she had no thoughts of harming herself or others. Pt stated she would alert staff if anything changes.   Candy Sledge 09/16/2019, 2:05 AM

## 2019-09-17 LAB — HEMOGLOBIN A1C
Hgb A1c MFr Bld: 4.9 % (ref 4.8–5.6)
Mean Plasma Glucose: 94 mg/dL

## 2019-09-17 MED ORDER — HYDROXYZINE HCL 50 MG PO TABS
50.0000 mg | ORAL_TABLET | Freq: Three times a day (TID) | ORAL | Status: DC | PRN
  Administered 2019-09-17: 50 mg via ORAL
  Filled 2019-09-17: qty 1

## 2019-09-17 MED ORDER — QUETIAPINE FUMARATE 300 MG PO TABS
300.0000 mg | ORAL_TABLET | Freq: Every day | ORAL | Status: DC
  Administered 2019-09-17: 300 mg via ORAL
  Filled 2019-09-17 (×2): qty 1
  Filled 2019-09-17 (×2): qty 7

## 2019-09-17 MED ORDER — AMOXICILLIN 500 MG PO CAPS
500.0000 mg | ORAL_CAPSULE | Freq: Three times a day (TID) | ORAL | Status: DC
  Administered 2019-09-17 – 2019-09-18 (×3): 500 mg via ORAL
  Filled 2019-09-17: qty 18
  Filled 2019-09-17: qty 1
  Filled 2019-09-17 (×2): qty 18
  Filled 2019-09-17 (×3): qty 1
  Filled 2019-09-17: qty 18
  Filled 2019-09-17: qty 1
  Filled 2019-09-17: qty 18
  Filled 2019-09-17: qty 2
  Filled 2019-09-17: qty 1
  Filled 2019-09-17: qty 18

## 2019-09-17 MED ORDER — QUETIAPINE FUMARATE 100 MG PO TABS
100.0000 mg | ORAL_TABLET | Freq: Two times a day (BID) | ORAL | Status: DC
  Administered 2019-09-17 – 2019-09-18 (×2): 100 mg via ORAL
  Filled 2019-09-17: qty 14
  Filled 2019-09-17: qty 1
  Filled 2019-09-17: qty 14
  Filled 2019-09-17 (×2): qty 1
  Filled 2019-09-17 (×2): qty 14
  Filled 2019-09-17: qty 1

## 2019-09-17 MED ORDER — CLONAZEPAM 0.5 MG PO TABS
0.5000 mg | ORAL_TABLET | Freq: Two times a day (BID) | ORAL | Status: DC
  Administered 2019-09-17 – 2019-09-18 (×2): 0.5 mg via ORAL
  Filled 2019-09-17 (×2): qty 1

## 2019-09-17 MED ORDER — PREGABALIN 100 MG PO CAPS
200.0000 mg | ORAL_CAPSULE | Freq: Three times a day (TID) | ORAL | Status: DC
  Administered 2019-09-17 – 2019-09-18 (×3): 200 mg via ORAL
  Filled 2019-09-17 (×3): qty 2

## 2019-09-17 NOTE — Plan of Care (Signed)
Nurse discussed anxiety, depression, coping skills with patient. 

## 2019-09-17 NOTE — Progress Notes (Signed)
Recreation Therapy Notes  Date: 9.24.20 Time: 0945 Location: [address] Dayroom   Group Topic: Communication, Team Building, Problem Solving  Goal Area(s) Addresses:  Patient will effectively work with peer towards shared goal.  Patient will identify skill used to make activity successful.  Patient will identify how skills used during activity can be used to reach post d/c goals.   Intervention: STEM Activity   Activity: [employer]. Patients were provided the following materials: 5 drinking straws, 5 rubber bands, 5 paper clips, 2 index cards and 2 drinking cups. Using the provided materials patients were asked to build a launching mechanisms to launch a ping pong ball approximately 12 feet. Patients were divided into teams of 3-5.   Education: Education officer, community, Dentist.   Education Outcome: Acknowledges education/In group clarification offered/Needs additional education.   Clinical Observations/Feedback:  Pt did not attend group.    Victorino Sparrow, LRT/CTRS         Victorino Sparrow A 09/17/2019 10:56 AM

## 2019-09-17 NOTE — [facility] Group Notes (Signed)
LCSW Group Therapy Note  Date and Time: 09/17/2019 @ 1:30pm  Type of Therapy and Topic: Group Therapy: Feelings Around Returning Home & Establishing a Supportive Framework and Supporting Oneself When Supports Not Available  Participation Level: [facility] PARTICIPATION LEVEL: Did Not Attend  Mood: Did not attend     Description of Group:  Patients first processed thoughts and feelings about upcoming discharge. These included fears of upcoming changes, lack of change, new living environments, judgements and expectations from others and overall stigma of mental health issues. The group then discussed the definition of a supportive framework, what that looks and feels like, and how do to discern it from an unhealthy non-supportive network. The group identified different types of supports as well as what to do when your family/friends are less than helpful or unavailable     Therapeutic Goals   1.  Patient will identify one healthy supportive network that they can use at discharge.  2.  Patient will identify one factor of a supportive framework and how to tell it from an unhealthy network.  3.  Patient able to identify one coping skill to use when they do not have positive supports from others.  4.  Patient will demonstrate ability to communicate their needs through discussion and/or role plays.     Summary of Patient Progress:     Patient did not attend group today.        Therapeutic Modalities  Cognitive Behavioral Therapy  Motivational Interviewing   Ardelle Anton, MSW, [place]
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
## 2019-09-17 NOTE — Progress Notes (Addendum)
D: Pt denies SI/HI/AVH. Pt is pleasant and cooperative. Pt stated  She was feeling better , ready for D/C tomorrow. A: Pt was offered support and encouragement. Pt was given scheduled medications. Pt was encourage to attend groups. Q 15 minute checks were done for safety.  R:Pt attends groups and interacts well with peers and staff. Pt is taking medication. Pt has no complaints.Pt receptive to treatment and safety maintained on unit.   Problem: Education: Goal: Understanding of discharge needs will improve Outcome: Progressing   Problem: Health Behavior/Discharge Planning: Goal: Ability to identify changes in lifestyle to reduce recurrence of condition will improve Outcome: Progressing

## 2019-09-18 MED ORDER — AMOXICILLIN 500 MG PO CAPS: 500.0000 mg | ORAL_CAPSULE | Freq: Three times a day (TID) | ORAL | 1 refills | Status: DC

## 2019-09-18 MED ORDER — QUETIAPINE FUMARATE 100 MG PO TABS: ORAL_TABLET | ORAL | 1 refills | Status: DC

## 2019-09-18 MED ORDER — PRAZOSIN HCL 2 MG PO CAPS: 4.0000 mg | ORAL_CAPSULE | Freq: Every day | ORAL | 1 refills | Status: DC

## 2019-09-18 MED ORDER — PREGABALIN 200 MG PO CAPS: 200.0000 mg | ORAL_CAPSULE | Freq: Three times a day (TID) | ORAL | 1 refills | Status: DC

## 2019-09-18 MED ORDER — FLUOXETINE HCL 20 MG PO CAPS: 20.0000 mg | ORAL_CAPSULE | Freq: Every day | ORAL | 1 refills | Status: DC

## 2019-09-18 NOTE — [facility] Suicide Risk Assessment (Signed)
[place] INPATIENT:  Family/Significant Other Suicide Prevention Education  Suicide Prevention Education:  Education Completed; Pt's significant other, Sherry Ruffing, has been identified by the patient as the family member/significant other with whom the patient will be residing, and identified as the person(s) who will aid the patient in the event of a mental health crisis (suicidal ideations/suicide attempt).  With written consent from the patient, the family member/significant other has been provided the following suicide prevention education, prior to the and/or following the discharge of the patient.  The suicide prevention education provided includes the following:  Suicide risk factors  Suicide prevention and interventions  National Suicide Hotline telephone number  [facility] assessment telephone number  [facility] Emergency Assistance [facility] and/or Residential Mobile Crisis Unit telephone number  Request made of family/significant other to:  Remove weapons (e.g., guns, rifles, knives), all items previously/currently identified as safety concern.    Remove drugs/medications (over-the-counter, prescriptions, illicit drugs), all items previously/currently identified as a safety concern.  The family member/significant other verbalizes understanding of the suicide prevention education information provided.  The family member/significant other agrees to remove the items of safety concern listed above.   CSW contacted pt's significant other, Sherry Ruffing. Pt's significant other stated that he does not have any questions or concerns. He stated that he will be at [facility] to pick her up around [place] 09/18/2019, 9:29 AM

## 2019-09-18 NOTE — Tx Team (Signed)
Interdisciplinary Treatment and Diagnostic Plan Update  09/18/2019 Time of Session: 10:30am Kayla Silva MRN: [id number]  Principal Diagnosis: Severe recurrent major depression without psychotic features [facility])  Secondary Diagnoses: Principal Problem:   Severe recurrent major depression without psychotic features (HCC)   Current Medications:  Current Facility-Administered Medications  Medication Dose Route Frequency Provider Last Rate Last Dose  . acetaminophen (TYLENOL) tablet 650 mg  650 mg Oral Q6H PRN Nira Conn A, NP      . alum & mag hydroxide-simeth (MAALOX/MYLANTA) 200-200-20 MG/5ML suspension 30 mL  30 mL Oral Q4H PRN Nira Conn A, NP      . amoxicillin (AMOXIL) capsule 500 mg  500 mg Oral Q8H Malvin Johns, MD   500 mg at 09/18/19 2094  . clonazePAM (KLONOPIN) tablet 0.5 mg  0.5 mg Oral BID Malvin Johns, MD   0.5 mg at 09/18/19 0848  . FLUoxetine (PROZAC) capsule 20 mg  20 mg Oral Daily Malvin Johns, MD   20 mg at 09/18/19 0845  . hydrOXYzine (ATARAX/VISTARIL) tablet 50 mg  50 mg Oral TID PRN Malvin Johns, MD   50 mg at 09/17/19 0935  . magnesium hydroxide (MILK OF MAGNESIA) suspension 30 mL  30 mL Oral Daily PRN Nira Conn A, NP      . nicotine (NICODERM CQ - dosed in mg/24 hours) patch 14 mg  14 mg Transdermal Daily Antonieta Pert, MD   14 mg at 09/18/19 0846  . OLANZapine zydis (ZYPREXA) disintegrating tablet 10 mg  10 mg Oral Q8H PRN Antonieta Pert, MD   10 mg at 09/17/19 7096   And  . ziprasidone (GEODON) injection 20 mg  20 mg Intramuscular PRN Antonieta Pert, MD      . prazosin (MINIPRESS) capsule 4 mg  4 mg Oral QHS Malvin Johns, MD   4 mg at 09/17/19 2106  . pregabalin (LYRICA) capsule 200 mg  200 mg Oral TID Malvin Johns, MD   200 mg at 09/18/19 0848  . QUEtiapine (SEROQUEL) tablet 100 mg  100 mg Oral BID Malvin Johns, MD   100 mg at 09/18/19 0845  . QUEtiapine (SEROQUEL) tablet 300 mg  300 mg Oral QHS Malvin Johns, MD   300 mg at 09/17/19 2106  .  rOPINIRole (REQUIP) tablet 0.25 mg  0.25 mg Oral QHS Antonieta Pert, MD   0.25 mg at 09/17/19 2107   PTA Medications: Medications Prior to Admission  Medication Sig Dispense Refill Last Dose  . clonazePAM (KLONOPIN) 1 MG tablet Take 1 mg by mouth 2 (two) times daily.     . DULoxetine (CYMBALTA) 30 MG capsule Take 30 mg by mouth daily.     Marland Kitchen OLANZapine (ZYPREXA) 5 MG tablet Take 5 mg by mouth at bedtime.     Marland Kitchen acamprosate (CAMPRAL) 333 MG tablet Take 2 tablets (666 mg total) by mouth 3 (three) times daily with meals. (Patient not taking: Reported on 09/15/2019) 180 tablet 0 Not Taking at Unknown time  . escitalopram (LEXAPRO) 10 MG tablet Take 1 tablet (10 mg total) by mouth daily. (Patient not taking: Reported on 09/15/2019) 30 tablet 0 Not Taking at Unknown time  . lamoTRIgine (LAMICTAL) 25 MG tablet Take 1 tablet (25 mg total) by mouth 2 (two) times daily. (Patient not taking: Reported on 09/15/2019) 60 tablet 0 Not Taking at Unknown time  . naproxen (NAPROSYN) 375 MG tablet Take 1 tablet (375 mg total) by mouth 2 (two) times daily with a meal. (Patient not  taking: Reported on 09/15/2019) 10 tablet 0 Not Taking at Unknown time  . prazosin (MINIPRESS) 1 MG capsule Take 1 capsule (1 mg total) by mouth at bedtime. (Patient not taking: Reported on 09/15/2019) 14 capsule 0 Not Taking at Unknown time  . QUEtiapine (SEROQUEL) 200 MG tablet Take 1 tablet (200 mg total) by mouth at bedtime. (Patient not taking: Reported on 09/15/2019) 30 tablet 0 Not Taking at Unknown time  . QUEtiapine (SEROQUEL) 50 MG tablet Take 1 tablet (50 mg total) by mouth 2 (two) times daily. (Patient not taking: Reported on 09/15/2019) 60 tablet 0 Not Taking at Unknown time  . rOPINIRole (REQUIP) 0.5 MG tablet Take 1 tablet (0.5 mg total) by mouth 3 (three) times daily. (Patient not taking: Reported on 09/15/2019) 90 tablet 0 Not Taking at Unknown time    Patient Stressors: Medication change or noncompliance Traumatic  event  Patient Strengths: Ability for insight Capable of independent living Communication skills Motivation for treatment/growth Physical Health Supportive family/friends  Treatment Modalities: Medication Management, Group therapy, Case management,  1 to 1 session with clinician, Psychoeducation, Recreational therapy.   Physician Treatment Plan for Primary Diagnosis: Severe recurrent major depression without psychotic features ([place]) Long Term Goal(s): Improvement in symptoms so as ready for discharge Improvement in symptoms so as ready for discharge   Short Term Goals: Ability to identify changes in lifestyle to reduce recurrence of condition will improve Ability to verbalize feelings will improve Ability to disclose and discuss suicidal ideas Ability to demonstrate self-control will improve Ability to identify and develop effective coping behaviors will improve Ability to maintain clinical measurements within normal limits will improve Compliance with prescribed medications will improve Ability to identify triggers associated with substance abuse/mental health issues will improve Ability to identify changes in lifestyle to reduce recurrence of condition will improve Ability to verbalize feelings will improve Ability to disclose and discuss suicidal ideas Ability to demonstrate self-control will improve Ability to identify and develop effective coping behaviors will improve Ability to maintain clinical measurements within normal limits will improve Compliance with prescribed medications will improve Ability to identify triggers associated with substance abuse/mental health issues will improve  Medication Management: Evaluate patient's response, side effects, and tolerance of medication regimen.  Therapeutic Interventions: 1 to 1 sessions, Unit Group sessions and Medication administration.  Evaluation of Outcomes: Adequate for Discharge  Physician Treatment Plan for Secondary  Diagnosis: Principal Problem:   Severe recurrent major depression without psychotic features ([place])  Long Term Goal(s): Improvement in symptoms so as ready for discharge Improvement in symptoms so as ready for discharge   Short Term Goals: Ability to identify changes in lifestyle to reduce recurrence of condition will improve Ability to verbalize feelings will improve Ability to disclose and discuss suicidal ideas Ability to demonstrate self-control will improve Ability to identify and develop effective coping behaviors will improve Ability to maintain clinical measurements within normal limits will improve Compliance with prescribed medications will improve Ability to identify triggers associated with substance abuse/mental health issues will improve Ability to identify changes in lifestyle to reduce recurrence of condition will improve Ability to verbalize feelings will improve Ability to disclose and discuss suicidal ideas Ability to demonstrate self-control will improve Ability to identify and develop effective coping behaviors will improve Ability to maintain clinical measurements within normal limits will improve Compliance with prescribed medications will improve Ability to identify triggers associated with substance abuse/mental health issues will improve     Medication Management: Evaluate patient's response, side effects, and  tolerance of medication regimen.  Therapeutic Interventions: 1 to 1 sessions, Unit Group sessions and Medication administration.  Evaluation of Outcomes: Adequate for Discharge   RN Treatment Plan for Primary Diagnosis: Severe recurrent major depression without psychotic features (HCC) Long Term Goal(s): Knowledge of disease and therapeutic regimen to maintain health will improve  Short Term Goals: Ability to participate in decision making will improve, Ability to verbalize feelings will improve, Ability to disclose and discuss suicidal ideas, Ability  to identify and develop effective coping behaviors will improve and Compliance with prescribed medications will improve  Medication Management: RN will administer medications as ordered by provider, will assess and evaluate patient's response and provide education to patient for prescribed medication. RN will report any adverse and/or side effects to prescribing provider.  Therapeutic Interventions: 1 on 1 counseling sessions, Psychoeducation, Medication administration, Evaluate responses to treatment, Monitor vital signs and CBGs as ordered, Perform/monitor CIWA, COWS, AIMS and Fall Risk screenings as ordered, Perform wound care treatments as ordered.  Evaluation of Outcomes: Adequate for Discharge   LCSW Treatment Plan for Primary Diagnosis: Severe recurrent major depression without psychotic features (HCC) Long Term Goal(s): Safe transition to appropriate next level of care at discharge, Engage patient in therapeutic group addressing interpersonal concerns.  Short Term Goals: Engage patient in aftercare planning with referrals and resources and Increase skills for wellness and recovery  Therapeutic Interventions: Assess for all discharge needs, 1 to 1 time with Social worker, Explore available resources and support systems, Assess for adequacy in community support network, Educate family and significant other(s) on suicide prevention, Complete Psychosocial Assessment, Interpersonal group therapy.  Evaluation of Outcomes: Adequate for Discharge   Progress in Treatment: Attending groups: No. Participating in groups: No. Taking medication as prescribed: Yes. Toleration medication: Yes. Family/Significant other contact made: Yes, individual(s) contacted:  pt's significant other Patient understands diagnosis: Yes. Discussing patient identified problems/goals with staff: Yes. Medical problems stabilized or resolved: Yes. Denies suicidal/homicidal ideation: Yes. Issues/concerns per patient  self-inventory: No. Other:   New problem(s) identified: No, Describe:  None  New Short Term/Long Term Goal(s): Medication stabilization, elimination of SI thoughts, and development of a comprehensive mental wellness plan.   Patient Goals:  "Medication management"  Discharge Plan or Barriers: [facility] [place], [place].   Reason for Continuation of Hospitalization: Pt is discharging today  Estimated Length of Stay: Patient is discharging today   Attendees: Patient:  09/18/2019  Physician: Dr. Malvin JohnsBrian Farah, MD 09/18/2019   Nursing: Luanne BrasRoni, RN 09/18/2019  RN Care Manager: 09/18/2019   Social Worker: Stephannie PetersJasmine Zacharee Gaddie, LCSW 09/18/2019   Recreational Therapist:  09/18/2019  MSW Intern: Earlyne IbaSophia Nagy 09/18/2019   Other:  09/18/2019   Other: 09/18/2019       Scribe for Treatment Team: Delphia GratesJasmine M Danilynn Jemison, LCSW 09/18/2019 10:57 AM

## 2019-09-18 NOTE — Progress Notes (Signed)
Spirituality group facilitated by Chaplain Preciliano Castell, MDiv, BCC.  Group Description:  Group focused on topic of hope.  Patients participated in facilitated discussion around topic, connecting with one another around experiences and definitions for hope.  Group members engaged with visual explorer photos, reflecting on what hope looks like for them today.  Group engaged in discussion around how their definitions of hope are present today in hospital.   Modalities: Psycho-social ed, Adlerian, Narrative, MI Patient Progress: Did not attend  

## 2019-09-18 NOTE — Plan of Care (Signed)
Pt was able to identify coping skills at completion of recreation therapy group sessions.   Sheryll Dymek, LRT/CTRS 

## 2019-09-18 NOTE — [facility] Suicide Risk Assessment (Signed)
[facility] Discharge Suicide Risk Assessment   Principal Problem: Severity of depressive symptoms/substance abuse Discharge Diagnoses: Active Problems:   Severe recurrent major depression without psychotic features ([place])   Total Time spent with patient: 45 minutes  Musculoskeletal: Strength & Muscle Tone: within normal limits Gait & Station: normal Patient leans: N/A  Psychiatric Specialty Exam: ROS  Blood pressure 120/73, pulse (!) 108, temperature 98.8 F (37.1 C), temperature source Oral, resp. rate 18, height 5' (1.524 m), weight 69.9 kg, SpO2 100 %.Body mass index is 30.08 kg/m.  General Appearance: Casual  Eye Contact::  Good  Speech:  Clear and [id number]  Volume:  Normal  Mood:  Euthymic  Affect:  Full Range  Thought Process:  Coherent and Linear  Orientation:  Full (Time, Place, and Person)  Thought Content:  Rumination  Suicidal Thoughts:  No  Homicidal Thoughts:  No  Memory:  Immediate;   Fair Recent;   Fair Remote;   Fair  Judgement:  Intact  Insight:  Fair  Psychomotor Activity:  Normal  Concentration:  Good  Recall:  Good  Fund of Knowledge:Good  Language: Good  Akathisia:  Negative  Handed:  Right  AIMS (if indicated):     Assets:  Communication Skills Desire for Improvement Housing Leisure Time Physical Health Resilience  Sleep:  Number of Hours: 10.5  Cognition: WNL  ADL's:  Intact   Mental Status Per Nursing Assessment::   On Admission:  Suicidal ideation indicated by patient, Suicide plan  Demographic Factors:  Low socioeconomic status  Loss Factors: Decrease in vocational status  Historical Factors: Impulsivity  Risk Reduction Factors:   Sense of responsibility to family and Religious beliefs about death  Continued Clinical Symptoms:  Alcohol/Substance Abuse/Dependencies  Cognitive Features That Contribute To Risk:  Polarized thinking    Suicide Risk:  Minimal: No identifiable suicidal ideation.  Patients presenting with no  risk factors but with morbid ruminations; may be classified as minimal risk based on the severity of the depressive symptoms  Follow-up [facility] Follow up.   Specialty: Catering manager information: [facility] [place] [address] [phone]           Plan Of Care/Follow-up recommendations:  Activity:  full  Gill Delrossi, MD 09/18/2019, 9:02 AM

## 2019-09-18 NOTE — Progress Notes (Signed)
Recreation Therapy Notes  INPATIENT RECREATION TR PLAN  Patient Details Name: Kayla Silva MRN: [id number] DOB: 1976/07/09 Today's Date: 09/18/2019  Rec Therapy Plan Is patient appropriate for Therapeutic Recreation?: Yes Treatment times per week: about 3 days Estimated Length of Stay: 5-7 days TR Treatment/Interventions: Group participation (Comment)  Discharge Criteria Pt will be discharged from therapy if:: Discharged Treatment plan/goals/alternatives discussed and agreed upon by:: Patient/family  Discharge Summary Short term goals set: See patient care plan Short term goals met: Complete Progress toward goals comments: Groups attended Which groups?: Anger management Reason goals not met: None Therapeutic equipment acquired: N/A Reason patient discharged from therapy: Discharge from hospital Pt/family agrees with progress & goals achieved: Yes Date patient discharged from therapy: 09/18/19    Victorino Sparrow, LRT/CTRS  Ria Comment, Zita Ozimek A 09/18/2019, 10:31 AM

## 2019-09-18 NOTE — Progress Notes (Signed)
  [facility] Adult Case Management Discharge Plan :  Will you be returning to the same living situation after discharge:  Yes,  with fiance At discharge, do you have transportation home?: Yes,  fiance will be picking her up Do you have the ability to pay for your medications: No.  Release of information consent forms completed and in the chart;  Patient's signature needed at discharge.  Patient to Follow up at: Follow-up Information    [facility] Follow up.   Specialty: Professional Counselor Why: Please follow up with clinic during walk-in hours; Monday-Friday 8:30a.-12:00p 1:00-2:30p.  Be sure to bring your photo ID, insurance card, SSN, and current medications.  Contact information: [facility] [place] [address] [phone]           Next level of care provider has access to [place] and Suicide Prevention discussed: Yes,  with pt's fiance     Has patient been referred to the Quitline?: Patient refused referral  Patient has been referred for addiction treatment: Yes  Billey Chang, Student-Social Work 09/18/2019, 9:30 AM

## 2019-09-18 NOTE — Progress Notes (Signed)
Pt discharged to lobby. Pt was stable and appreciative at that time. All papers, samples and prescriptions were given and valuables returned. Verbal understanding expressed. Denies SI/HI and A/VH. Pt given opportunity to express concerns and ask questions.  
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
## 2019-10-08 ENCOUNTER — Emergency Department (HOSPITAL_BASED_OUTPATIENT_CLINIC_OR_DEPARTMENT_OTHER)
Admission: EM | Admit: 2019-10-08 | Discharge: 2019-10-08 | Disposition: A | Discharge status: Home / Self Care | Payer: Self-pay | Attending: Emergency Medicine | Admitting: Emergency Medicine

## 2019-10-08 ENCOUNTER — Encounter (HOSPITAL_BASED_OUTPATIENT_CLINIC_OR_DEPARTMENT_OTHER): Payer: Self-pay

## 2019-10-08 ENCOUNTER — Other Ambulatory Visit: Payer: Self-pay

## 2019-10-08 DIAGNOSIS — Z888 Allergy status to other drugs, medicaments and biological substances status: Secondary | ICD-10-CM | POA: Insufficient documentation

## 2019-10-08 DIAGNOSIS — Z79899 Other long term (current) drug therapy: Secondary | ICD-10-CM | POA: Insufficient documentation

## 2019-10-08 DIAGNOSIS — F17210 Nicotine dependence, cigarettes, uncomplicated: Secondary | ICD-10-CM | POA: Insufficient documentation

## 2019-10-08 DIAGNOSIS — L0231 Cutaneous abscess of buttock: Secondary | ICD-10-CM | POA: Insufficient documentation

## 2019-10-08 LAB — PREGNANCY, URINE: Preg Test, Ur: NEGATIVE

## 2019-10-08 MED ORDER — DOXYCYCLINE HYCLATE 100 MG PO TABS
100.0000 mg | ORAL_TABLET | Freq: Once | ORAL | Status: AC
  Administered 2019-10-08: 100 mg via ORAL
  Filled 2019-10-08: qty 1

## 2019-10-08 MED ORDER — LIDOCAINE-EPINEPHRINE (PF) 2 %-1:200000 IJ SOLN
INTRAMUSCULAR | Status: AC
  Administered 2019-10-08: 20 mL via INTRADERMAL
  Filled 2019-10-08: qty 10

## 2019-10-08 MED ORDER — LIDOCAINE-EPINEPHRINE (PF) 2 %-1:200000 IJ SOLN
20.0000 mL | Freq: Once | INTRAMUSCULAR | Status: AC
  Administered 2019-10-08: 03:00:00 20 mL via INTRADERMAL
  Filled 2019-10-08: qty 20

## 2019-10-08 MED ORDER — DOXYCYCLINE HYCLATE 100 MG PO CAPS: 100.0000 mg | ORAL_CAPSULE | Freq: Two times a day (BID) | ORAL | 0 refills | Status: DC

## 2019-10-08 MED ORDER — LIDOCAINE-EPINEPHRINE 2 %-1:100000 IJ SOLN
20.0000 mL | Freq: Once | INTRAMUSCULAR | Status: DC
  Filled 2019-10-08: qty 20

## 2019-10-08 NOTE — ED Provider Notes (Signed)
[facility] DEPT [facility] Provider Note: Georgena Spurling, MD, FACEP  CSN: [id number] MRN: [id number] ARRIVAL: 10/08/19 at [place]: [id number]/[id number]   CHIEF COMPLAINT  Abscess   HISTORY OF PRESENT ILLNESS  10/08/19 2:58 AM Hildred Alamin SHAENA PARKERSON is a 43 y.o. female with a tender, erythematous, swollen area on her left buttock that is been present for about a week.  She has had abscesses before and believes this is another one.  She rates associated pain is a 7 out of 10, worse with movement or sitting.  She denies systemic symptoms such as fever or chills.  There has been no drainage.  She has bipolar disorder and has had increased stress and difficulty sleeping lately which she attributes to her bipolar disorder.   Past Medical History:  Diagnosis Date  . Bipolar 1 disorder ([place])   . Depression     Past Surgical History:  Procedure Laterality Date  . TUBAL LIGATION      No family history on file.  Social History   Tobacco Use  . Smoking status: Current Every Day Smoker    Packs/day: 1.00    Types: Cigarettes  . Smokeless tobacco: Never Used  Substance Use Topics  . Alcohol use: Yes    Alcohol/week: 8.0 - 10.0 standard drinks    Types: 8 - 10 Cans of beer per week  . Drug use: Yes    Types: Marijuana, Cocaine    Prior to Admission medications   Medication Sig Start Date End Date Taking? Authorizing Provider  acamprosate (CAMPRAL) 333 MG tablet Take 2 tablets (666 mg total) by mouth 3 (three) times daily with meals. Patient not taking: Reported on 09/15/2019 06/28/14   Benjamine Mola, FNP  amoxicillin (AMOXIL) 500 MG capsule Take 1 capsule (500 mg total) by mouth every 8 (eight) hours. 09/18/19   Johnn Hai, MD  FLUoxetine (PROZAC) 20 MG capsule Take 1 capsule (20 mg total) by mouth daily. 09/19/19   Johnn Hai, MD  naproxen (NAPROSYN) 375 MG tablet Take 1 tablet (375 mg total) by mouth 2 (two) times daily with a meal. Patient not taking: Reported on 09/15/2019 08/16/19   Palumbo,  April, MD  prazosin (MINIPRESS) 2 MG capsule Take 2 capsules (4 mg total) by mouth at bedtime. 09/18/19   Johnn Hai, MD  pregabalin (LYRICA) 200 MG capsule Take 1 capsule (200 mg total) by mouth 3 (three) times daily. 09/18/19   Johnn Hai, MD  QUEtiapine (SEROQUEL) 100 MG tablet 1 bid 3 at hs 09/18/19   Johnn Hai, MD  rOPINIRole (REQUIP) 0.5 MG tablet Take 1 tablet (0.5 mg total) by mouth 3 (three) times daily. Patient not taking: Reported on 09/15/2019 06/28/14   Benjamine Mola, FNP    Allergies Haldol [haloperidol lactate], Lithium, and Wellbutrin [bupropion]   REVIEW OF SYSTEMS  Negative except as noted here or in the History of Present Illness.   PHYSICAL EXAMINATION  Initial Vital Signs Blood pressure 119/67, pulse (!) 110, temperature 98 F (36.7 C), resp. rate 17, height 5' (1.524 m), weight 68 kg, last menstrual period 09/16/2019, SpO2 99 %.  Examination General: Well-developed, well-nourished female in no acute distress; appearance consistent with age of record HENT: normocephalic; atraumatic Eyes: pupils equal, round and reactive to light; extraocular muscles intact Neck: supple Heart: regular rate and rhythm Lungs: clear to auscultation bilaterally Abdomen: soft; nondistended; nontender; bowel sounds present Extremities: No deformity; full range of motion; pulses normal Neurologic: Awake, alert and oriented; motor function intact in  all extremities and symmetric; no facial droop Skin: Warm and dry; pointing abscess left buttock with surrounding erythema Psychiatric: Normal mood and affect   RESULTS  Summary of this visit's results, reviewed by myself:   EKG Interpretation  Date/Time:    Ventricular Rate:    PR Interval:    QRS Duration:   QT Interval:    QTC Calculation:   R Axis:     Text Interpretation:        Laboratory Studies: Results for orders placed or performed during the hospital encounter of 10/08/19 (from the past 24 hour(s))  Pregnancy,  urine     Status: None   Collection Time: 10/08/19  3:07 AM  Result Value Ref Range   Preg Test, Ur NEGATIVE NEGATIVE   Imaging Studies: No results found.  ED COURSE and MDM  Nursing notes and initial vitals signs, including pulse oximetry, reviewed.  Vitals:   10/08/19 0154 10/08/19 0156  BP:  119/67  Pulse:  (!) 110  Resp:  17  Temp:  98 F (36.7 C)  SpO2:  99%  Weight: 68 kg   Height: 5' (1.524 m)    Given significant surrounding erythema we will place the patient on doxycycline for a week.  PROCEDURES   INCISION AND DRAINAGE Performed by: Carlisle Beers Burdette Gergely Consent: Verbal consent obtained. Risks and benefits: risks, benefits and alternatives were discussed Type: abscess  Body area: Left buttock  Anesthesia: local infiltration  Incision was made with an 18-gauge needle.  Local anesthetic: lidocaine 2% 1 epinephrine  Anesthetic total: 2 ml  Complexity: Simple  Drainage: purulent  Drainage amount: Moderate  Packing material: None  Patient tolerance: Patient tolerated the procedure well with no immediate complications.   ED DIAGNOSES     ICD-10-CM   1. Abscess of left buttock  L02.31        Jhon Mallozzi, Jonny Ruiz, MD 10/08/19 [phone]

## 2019-10-08 NOTE — ED Triage Notes (Signed)
Pt reports abcess to buttocks x 1 week- denies drainage-denies fevers. Hx of same.

## 2020-01-12 ENCOUNTER — Other Ambulatory Visit: Payer: Self-pay

## 2020-01-12 ENCOUNTER — Inpatient Hospital Stay (HOSPITAL_COMMUNITY)
Admission: AD | Admit: 2020-01-12 | Discharge: 2020-01-16 | DRG: 885 | Disposition: A | Discharge status: Home / Self Care | Payer: Federal, State, Local not specified - Other | Source: Intra-hospital | Attending: Psychiatry | Admitting: Psychiatry

## 2020-01-12 ENCOUNTER — Encounter (HOSPITAL_COMMUNITY): Payer: Self-pay | Admitting: Psychiatry

## 2020-01-12 DIAGNOSIS — Z91410 Personal history of adult physical and sexual abuse: Secondary | ICD-10-CM | POA: Diagnosis not present

## 2020-01-12 DIAGNOSIS — F1021 Alcohol dependence, in remission: Secondary | ICD-10-CM | POA: Diagnosis present

## 2020-01-12 DIAGNOSIS — F17210 Nicotine dependence, cigarettes, uncomplicated: Secondary | ICD-10-CM | POA: Diagnosis present

## 2020-01-12 DIAGNOSIS — F332 Major depressive disorder, recurrent severe without psychotic features: Secondary | ICD-10-CM | POA: Diagnosis present

## 2020-01-12 DIAGNOSIS — Z811 Family history of alcohol abuse and dependence: Secondary | ICD-10-CM | POA: Diagnosis not present

## 2020-01-12 DIAGNOSIS — Z20822 Contact with and (suspected) exposure to covid-19: Secondary | ICD-10-CM | POA: Diagnosis present

## 2020-01-12 DIAGNOSIS — Z915 Personal history of self-harm: Secondary | ICD-10-CM | POA: Diagnosis not present

## 2020-01-12 DIAGNOSIS — F4310 Post-traumatic stress disorder, unspecified: Secondary | ICD-10-CM | POA: Diagnosis present

## 2020-01-12 DIAGNOSIS — Z818 Family history of other mental and behavioral disorders: Secondary | ICD-10-CM

## 2020-01-12 DIAGNOSIS — Z23 Encounter for immunization: Secondary | ICD-10-CM | POA: Diagnosis not present

## 2020-01-12 DIAGNOSIS — Z560 Unemployment, unspecified: Secondary | ICD-10-CM | POA: Diagnosis not present

## 2020-01-12 DIAGNOSIS — Z888 Allergy status to other drugs, medicaments and biological substances status: Secondary | ICD-10-CM | POA: Diagnosis not present

## 2020-01-12 DIAGNOSIS — F3181 Bipolar II disorder: Principal | ICD-10-CM | POA: Diagnosis present

## 2020-01-12 DIAGNOSIS — F1020 Alcohol dependence, uncomplicated: Secondary | ICD-10-CM | POA: Diagnosis present

## 2020-01-12 LAB — RESPIRATORY PANEL BY RT PCR (FLU A&B, COVID)
Influenza A by PCR: NEGATIVE
Influenza B by PCR: NEGATIVE
SARS Coronavirus 2 by RT PCR: NEGATIVE

## 2020-01-12 MED ORDER — GABAPENTIN 400 MG PO CAPS
800.0000 mg | ORAL_CAPSULE | Freq: Three times a day (TID) | ORAL | Status: DC
  Administered 2020-01-12 – 2020-01-13 (×2): 800 mg via ORAL
  Filled 2020-01-12 (×6): qty 2

## 2020-01-12 MED ORDER — TRAZODONE HCL 150 MG PO TABS
150.0000 mg | ORAL_TABLET | Freq: Every day | ORAL | Status: DC
  Administered 2020-01-12: 150 mg via ORAL
  Filled 2020-01-12 (×3): qty 1

## 2020-01-12 MED ORDER — MAGNESIUM HYDROXIDE 400 MG/5ML PO SUSP: 30.0000 mL | Freq: Every day | ORAL | Status: DC | PRN

## 2020-01-12 MED ORDER — CLONAZEPAM 1 MG PO TABS
1.0000 mg | ORAL_TABLET | Freq: Two times a day (BID) | ORAL | Status: DC | PRN
  Administered 2020-01-13: 1 mg via ORAL
  Filled 2020-01-12: qty 1

## 2020-01-12 MED ORDER — HYDROXYZINE HCL 25 MG PO TABS
25.0000 mg | ORAL_TABLET | Freq: Three times a day (TID) | ORAL | Status: DC | PRN
  Administered 2020-01-13 – 2020-01-15 (×5): 25 mg via ORAL
  Filled 2020-01-12 (×2): qty 1
  Filled 2020-01-12: qty 10
  Filled 2020-01-12 (×3): qty 1

## 2020-01-12 MED ORDER — PRAZOSIN HCL 2 MG PO CAPS
4.0000 mg | ORAL_CAPSULE | Freq: Every day | ORAL | Status: DC
  Administered 2020-01-12: 4 mg via ORAL
  Filled 2020-01-12 (×2): qty 2
  Filled 2020-01-12: qty 4

## 2020-01-12 MED ORDER — OLANZAPINE 10 MG PO TABS
10.0000 mg | ORAL_TABLET | Freq: Every day | ORAL | Status: DC
  Administered 2020-01-12: 10 mg via ORAL
  Filled 2020-01-12 (×3): qty 1

## 2020-01-12 MED ORDER — ALUM & MAG HYDROXIDE-SIMETH 200-200-20 MG/5ML PO SUSP: 30.0000 mL | ORAL | Status: DC | PRN

## 2020-01-12 MED ORDER — INFLUENZA VAC SPLIT QUAD 0.5 ML IM SUSY
0.5000 mL | PREFILLED_SYRINGE | INTRAMUSCULAR | Status: AC
  Administered 2020-01-13: 0.5 mL via INTRAMUSCULAR
  Filled 2020-01-12: qty 0.5

## 2020-01-12 MED ORDER — DULOXETINE HCL 60 MG PO CPEP
60.0000 mg | ORAL_CAPSULE | Freq: Every day | ORAL | Status: DC
  Administered 2020-01-13: 60 mg via ORAL
  Filled 2020-01-12 (×4): qty 1

## 2020-01-12 MED ORDER — PNEUMOCOCCAL VAC POLYVALENT 25 MCG/0.5ML IJ INJ
0.5000 mL | INJECTION | INTRAMUSCULAR | Status: AC
  Administered 2020-01-13: 0.5 mL via INTRAMUSCULAR

## 2020-01-12 MED ORDER — ACETAMINOPHEN 325 MG PO TABS
650.0000 mg | ORAL_TABLET | Freq: Four times a day (QID) | ORAL | Status: DC | PRN
  Administered 2020-01-13 – 2020-01-15 (×5): 650 mg via ORAL
  Filled 2020-01-12 (×5): qty 2

## 2020-01-12 NOTE — Discharge Summary (Signed)
  Patient to be transferred to [facility] inpatient for psychiatric treatment 

## 2020-01-12 NOTE — Progress Notes (Signed)
Psychoeducational Group Note  Date:  01/12/2020 Time:  2037  Group Topic/Focus:  Wrap-Up Group:   The focus of this group is to help patients review their daily goal of treatment and discuss progress on daily workbooks.  Participation Level: Did Not Attend  Participation Quality:  Not Applicable  Affect:  Not Applicable  Cognitive:  Not Applicable  Insight:  Not Applicable  Engagement in Group: Not Applicable  Additional Comments:  The patient did not attend group since she was asleep in her bedroom.   Hazle Coca S 01/12/2020, 8:37 PM
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
## 2020-01-12 NOTE — Progress Notes (Signed)
   01/12/20 2140  Psych Admission Type (Psych Patients Only)  Admission Status Voluntary  Psychosocial Assessment  Patient Complaints Depression;Sadness  Eye Contact Fair  Facial Expression Sad  Affect Depressed  Speech Logical/coherent  Interaction Assertive  Motor Activity Other (Comment) (WNL)  Appearance/Hygiene Unremarkable  Behavior Characteristics Cooperative  Mood Depressed;Sad  Aggressive Behavior  Effect No apparent injury  Thought Process  Coherency WDL  Content WDL  Delusions None reported or observed  Perception WDL  Hallucination None reported or observed  Judgment Poor  Confusion None  Danger to Self  Current suicidal ideation? Passive  Self-Injurious Behavior Some self-injurious ideation observed or expressed.  No lethal plan expressed   Agreement Not to Harm Self Yes  Description of Agreement verbal contract  Danger to Others  Danger to Others None reported or observed   Pt seen at med window. Flat affect with depressed mood. Pt grieving loss of boyfriend in November 2020 from OD. Pt endorses passive SI without plan to hurt herself. Pt contracts for safety.

## 2020-01-12 NOTE — H&P (Signed)
Behavioral Health Medical Screening Exam  Kayla Silva is an 44 y.o. female.  As a walk-in to [facility] behavioral health with sober living sponsor.  Patient's reporting suicidal ideations.  Reports grief and loss related to the passing of her fianc in November due to a drug overdose. Patient reported hopelessness, guilt and worsening anxiety.  reports a history of substance abuse.  Reports diagnosis of bipolar, anxiety and borderline personality.  Reported she is taking her medications as prescribed.  Will recommend inpatient admission. Support, encouragement  and reassurance was provided.   Total Time spent with patient: 15 minutes  Psychiatric Specialty Exam: Physical Exam  Vitals reviewed. Constitutional: She appears well-developed.    Review of Systems  Psychiatric/Behavioral: Positive for suicidal ideas.  All other systems reviewed and are negative.   There were no vitals taken for this visit.There is no height or weight on file to calculate BMI.  General Appearance: Casual  Eye Contact:  Fair  Speech:  Clear and Coherent  Volume:  Normal  Mood:  Anxious and Depressed  Affect:  Congruent  Thought Process:  Coherent  Orientation:  Full (Time, Place, and Person)  Thought Content:  Logical  Suicidal Thoughts:  Yes.  with intent/plan  Homicidal Thoughts:  No  Memory:  Immediate;   Fair Recent;   Fair  Judgement:  Fair  Insight:  Fair  Psychomotor Activity:  Normal  Concentration: Concentration: Fair  Recall:  [employer] of Knowledge:Fair  Language: Fair  Akathisia:  No  Handed:  Right  AIMS (if indicated):     Assets:  Communication Skills Desire for Improvement Resilience Social Support  Sleep:       Musculoskeletal: Strength & Muscle Tone: within normal limits Gait & Station: normal Patient leans: N/A  There were no vitals taken for this visit.  Recommendations: Inpatient admission recommended  Based on my evaluation the patient does not appear to have an  emergency medical condition.  Oneta Rack, NP 01/12/2020, 11:25 AM

## 2020-01-13 DIAGNOSIS — F4310 Post-traumatic stress disorder, unspecified: Secondary | ICD-10-CM

## 2020-01-13 DIAGNOSIS — F3181 Bipolar II disorder: Principal | ICD-10-CM

## 2020-01-13 LAB — COMPREHENSIVE METABOLIC PANEL
ALT: 13 U/L (ref 0–44)
AST: 15 U/L (ref 15–41)
Albumin: 4 g/dL (ref 3.5–5.0)
Alkaline Phosphatase: 77 U/L (ref 38–126)
Anion gap: 7 (ref 5–15)
BUN: 8 mg/dL (ref 6–20)
CO2: 24 mmol/L (ref 22–32)
Calcium: 8.9 mg/dL (ref 8.9–10.3)
Chloride: 107 mmol/L (ref 98–111)
Creatinine, Ser: 0.72 mg/dL (ref 0.44–1.00)
GFR calc Af Amer: >60 mL/min (ref >60)
GFR calc non Af Amer: >60 mL/min (ref >60)
Glucose, Bld: 98 mg/dL (ref 70–99)
Potassium: 4.2 mmol/L (ref 3.5–5.1)
Sodium: 138 mmol/L (ref 135–145)
Total Bilirubin: 0.4 mg/dL (ref 0.3–1.2)
Total Protein: 6.9 g/dL (ref 6.5–8.1)

## 2020-01-13 LAB — CBC
HCT: 44.2 % (ref 36.0–46.0)
Hemoglobin: 14.2 g/dL (ref 12.0–15.0)
MCH: 28.7 pg (ref 26.0–34.0)
MCHC: 32.1 g/dL (ref 30.0–36.0)
MCV: 89.3 fL (ref 80.0–100.0)
Platelets: 299 10*3/uL (ref 150–400)
RBC: 4.95 MIL/uL (ref 3.87–5.11)
RDW: 14.9 % (ref 11.5–15.5)
WBC: 5.6 10*3/uL (ref 4.0–10.5)
nRBC: 0 % (ref 0.0–0.2)

## 2020-01-13 LAB — LIPID PANEL
Cholesterol: 193 mg/dL (ref 0–200)
HDL: 47 mg/dL (ref >40)
LDL Cholesterol: 123 mg/dL — ABNORMAL HIGH (ref 0–99)
Total CHOL/HDL Ratio: 4.1 RATIO
Triglycerides: 113 mg/dL (ref <150)
VLDL: 23 mg/dL (ref 0–40)

## 2020-01-13 LAB — HEMOGLOBIN A1C
Hgb A1c MFr Bld: 4.6 % — ABNORMAL LOW (ref 4.8–5.6)
Mean Plasma Glucose: 85.32 mg/dL

## 2020-01-13 LAB — PREGNANCY, URINE: Preg Test, Ur: NEGATIVE

## 2020-01-13 LAB — RAPID URINE DRUG SCREEN, HOSP PERFORMED
Amphetamines: NOT DETECTED
Barbiturates: NOT DETECTED
Benzodiazepines: NOT DETECTED
Cocaine: NOT DETECTED
Opiates: NOT DETECTED
Tetrahydrocannabinol: NOT DETECTED

## 2020-01-13 LAB — TSH: TSH: 1.713 u[IU]/mL (ref 0.350–4.500)

## 2020-01-13 LAB — ETHANOL: Alcohol, Ethyl (B): <10 mg/dL (ref <10)

## 2020-01-13 MED ORDER — PRAZOSIN HCL 2 MG PO CAPS
2.0000 mg | ORAL_CAPSULE | Freq: Every day | ORAL | Status: DC
  Administered 2020-01-13 – 2020-01-15 (×3): 2 mg via ORAL
  Filled 2020-01-13 (×3): qty 1
  Filled 2020-01-13: qty 7
  Filled 2020-01-13: qty 1

## 2020-01-13 MED ORDER — LURASIDONE HCL 20 MG PO TABS
20.0000 mg | ORAL_TABLET | Freq: Every day | ORAL | Status: DC
  Administered 2020-01-14 – 2020-01-16 (×3): 20 mg via ORAL
  Filled 2020-01-13 (×3): qty 1
  Filled 2020-01-13: qty 7
  Filled 2020-01-13: qty 1

## 2020-01-13 MED ORDER — SERTRALINE HCL 50 MG PO TABS
50.0000 mg | ORAL_TABLET | Freq: Every day | ORAL | Status: DC
  Administered 2020-01-14 – 2020-01-16 (×3): 50 mg via ORAL
  Filled 2020-01-13 (×4): qty 1
  Filled 2020-01-13: qty 7

## 2020-01-13 MED ORDER — TRAZODONE HCL 50 MG PO TABS
50.0000 mg | ORAL_TABLET | Freq: Every evening | ORAL | Status: DC | PRN
  Administered 2020-01-13: 50 mg via ORAL
  Filled 2020-01-13: qty 1

## 2020-01-13 MED ORDER — GABAPENTIN 400 MG PO CAPS
400.0000 mg | ORAL_CAPSULE | Freq: Three times a day (TID) | ORAL | Status: DC
  Administered 2020-01-13 – 2020-01-16 (×10): 400 mg via ORAL
  Filled 2020-01-13: qty 21
  Filled 2020-01-13 (×8): qty 1
  Filled 2020-01-13: qty 21
  Filled 2020-01-13 (×2): qty 1
  Filled 2020-01-13: qty 21
  Filled 2020-01-13 (×2): qty 1

## 2020-01-13 MED ORDER — NICOTINE 21 MG/24HR TD PT24
21.0000 mg | MEDICATED_PATCH | Freq: Every day | TRANSDERMAL | Status: DC
  Administered 2020-01-13 – 2020-01-16 (×4): 21 mg via TRANSDERMAL
  Filled 2020-01-13 (×7): qty 1
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
## 2020-01-13 NOTE — Progress Notes (Signed)
Recreation Therapy Notes  Date:  1.20.21 Time: 0930 Location: [address] Dayroom  Group Topic: Stress Management  Goal Area(s) Addresses:  Patient will identify positive stress management techniques. Patient will identify benefits of using stress management post d/c.  Behavioral Response:  Engaged  Intervention: Stress Management  Activity :  Progressive Muscle Relaxation.  LRT introduced the stress management technique of progressive muscle relaxation.  LRT read a script that guided patients through each individual muscle group to tense them and then relax them.  Patients were to listen to follow along as LRT read script.  Education:  Stress Management, Discharge Planning.   Education Outcome: Acknowledges Education  Clinical Observations/Feedback: Pt attended and participated in activity.    Caroll Rancher, LRT/CTRS         Caroll Rancher A 01/13/2020 10:31 AM
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
## 2020-01-13 NOTE — Tx Team (Signed)
Interdisciplinary Treatment and Diagnostic Plan Update  01/13/2020 Time of Session:  Kayla Silva MRN: [id number]  Principal Diagnosis: <principal problem not specified>  Secondary Diagnoses: Active Problems:   Bipolar II disorder (HCC)   MDD (major depressive disorder), recurrent episode, severe (HCC)   PTSD (post-traumatic stress disorder)   Current Medications:  Current Facility-Administered Medications  Medication Dose Route Frequency Provider Last Rate Last Admin  . acetaminophen (TYLENOL) tablet 650 mg  650 mg Oral Q6H PRN Connye Burkitt, NP      . alum & mag hydroxide-simeth (MAALOX/MYLANTA) 200-200-20 MG/5ML suspension 30 mL  30 mL Oral Q4H PRN Connye Burkitt, NP      . gabapentin (NEURONTIN) capsule 400 mg  400 mg Oral TID Cobos, Myer Peer, MD   400 mg at 01/13/20 1143  . hydrOXYzine (ATARAX/VISTARIL) tablet 25 mg  25 mg Oral TID PRN Connye Burkitt, NP      . Derrill Memo ON 01/14/2020] lurasidone (LATUDA) tablet 20 mg  20 mg Oral Q breakfast Cobos, Fernando A, MD      . magnesium hydroxide (MILK OF MAGNESIA) suspension 30 mL  30 mL Oral Daily PRN Connye Burkitt, NP      . nicotine (NICODERM CQ - dosed in mg/24 hours) patch 21 mg  21 mg Transdermal Daily Cobos, Myer Peer, MD   21 mg at 01/13/20 1143  . prazosin (MINIPRESS) capsule 2 mg  2 mg Oral QHS Cobos, Myer Peer, MD      . Derrill Memo ON 01/14/2020] sertraline (ZOLOFT) tablet 50 mg  50 mg Oral Daily Cobos, Myer Peer, MD      . traZODone (DESYREL) tablet 50 mg  50 mg Oral QHS PRN Cobos, Myer Peer, MD       PTA Medications: Medications Prior to Admission  Medication Sig Dispense Refill Last Dose  . clonazePAM (KLONOPIN) 1 MG tablet Take 1 mg by mouth every 8 (eight) hours as needed for anxiety.     . CYMBALTA 60 MG capsule Take 60 mg by mouth daily.     Marland Kitchen gabapentin (NEURONTIN) 800 MG tablet Take 800 mg by mouth 3 (three) times daily.     . prazosin (MINIPRESS) 2 MG capsule Take 2 capsules (4 mg total) by mouth at bedtime. 90  capsule 1   . traZODone (DESYREL) 150 MG tablet Take 150 mg by mouth at bedtime.     Marland Kitchen ZOFRAN 4 MG tablet Take 1 mg by mouth 3 (three) times daily as needed for nausea.     Marland Kitchen ZYPREXA 5 MG tablet Take 5-10 mg by mouth 3 (three) times daily. Take one tablet (64m) twice daily and take two tablets (120m at bedtime     . doxycycline (VIBRAMYCIN) 100 MG capsule Take 1 capsule (100 mg total) by mouth 2 (two) times daily. One po bid x 7 days (Patient not taking: Reported on 01/12/2020) 14 capsule 0 Completed Course at Unknown time  . FLUoxetine (PROZAC) 20 MG capsule Take 1 capsule (20 mg total) by mouth daily. (Patient not taking: Reported on 01/12/2020) 90 capsule 1 Not Taking at Unknown time  . pregabalin (LYRICA) 200 MG capsule Take 1 capsule (200 mg total) by mouth 3 (three) times daily. (Patient not taking: Reported on 01/12/2020) 90 capsule 1 Not Taking at Unknown time  . QUEtiapine (SEROQUEL) 100 MG tablet 1 bid 3 at hs (Patient not taking: Reported on 01/12/2020) 150 tablet 1 Not Taking at Unknown time    Patient Stressors:  Patient Strengths:    Treatment Modalities: Medication Management, Group therapy, Case management,  1 to 1 session with clinician, Psychoeducation, Recreational therapy.   Physician Treatment Plan for Primary Diagnosis: <principal problem not specified> Long Term Goal(s): Improvement in symptoms so as ready for discharge Improvement in symptoms so as ready for discharge   Short Term Goals: Ability to identify changes in lifestyle to reduce recurrence of condition will improve Ability to verbalize feelings will improve Ability to disclose and discuss suicidal ideas Ability to demonstrate self-control will improve Ability to identify and develop effective coping behaviors will improve Ability to maintain clinical measurements within normal limits will improve Ability to identify changes in lifestyle to reduce recurrence of condition will improve Ability to verbalize  feelings will improve Ability to disclose and discuss suicidal ideas Ability to demonstrate self-control will improve Ability to identify and develop effective coping behaviors will improve Ability to maintain clinical measurements within normal limits will improve  Medication Management: Evaluate patient's response, side effects, and tolerance of medication regimen.  Therapeutic Interventions: 1 to 1 sessions, Unit Group sessions and Medication administration.  Evaluation of Outcomes: Not Met  Physician Treatment Plan for Secondary Diagnosis: Active Problems:   Bipolar II disorder (HCC)   MDD (major depressive disorder), recurrent episode, severe (HCC)   PTSD (post-traumatic stress disorder)  Long Term Goal(s): Improvement in symptoms so as ready for discharge Improvement in symptoms so as ready for discharge   Short Term Goals: Ability to identify changes in lifestyle to reduce recurrence of condition will improve Ability to verbalize feelings will improve Ability to disclose and discuss suicidal ideas Ability to demonstrate self-control will improve Ability to identify and develop effective coping behaviors will improve Ability to maintain clinical measurements within normal limits will improve Ability to identify changes in lifestyle to reduce recurrence of condition will improve Ability to verbalize feelings will improve Ability to disclose and discuss suicidal ideas Ability to demonstrate self-control will improve Ability to identify and develop effective coping behaviors will improve Ability to maintain clinical measurements within normal limits will improve     Medication Management: Evaluate patient's response, side effects, and tolerance of medication regimen.  Therapeutic Interventions: 1 to 1 sessions, Unit Group sessions and Medication administration.  Evaluation of Outcomes: Not Met   RN Treatment Plan for Primary Diagnosis: <principal problem not  specified> Long Term Goal(s): Knowledge of disease and therapeutic regimen to maintain health will improve  Short Term Goals: Ability to participate in decision making will improve, Ability to verbalize feelings will improve, Ability to disclose and discuss suicidal ideas, Ability to identify and develop effective coping behaviors will improve and Compliance with prescribed medications will improve  Medication Management: RN will administer medications as ordered by provider, will assess and evaluate patient's response and provide education to patient for prescribed medication. RN will report any adverse and/or side effects to prescribing provider.  Therapeutic Interventions: 1 on 1 counseling sessions, Psychoeducation, Medication administration, Evaluate responses to treatment, Monitor vital signs and CBGs as ordered, Perform/monitor CIWA, COWS, AIMS and Fall Risk screenings as ordered, Perform wound care treatments as ordered.  Evaluation of Outcomes: Not Met   LCSW Treatment Plan for Primary Diagnosis: <principal problem not specified> Long Term Goal(s): Safe transition to appropriate next level of care at discharge, Engage patient in therapeutic group addressing interpersonal concerns.  Short Term Goals: Engage patient in aftercare planning with referrals and resources  Therapeutic Interventions: Assess for all discharge needs, 1 to 1 time with Social  worker, Explore available resources and support systems, Assess for adequacy in community support network, Educate family and significant other(s) on suicide prevention, Complete Psychosocial Assessment, Interpersonal group therapy.  Evaluation of Outcomes: Not Met   Progress in Treatment: Attending groups: No. New to unit  Participating in groups: No. Taking medication as prescribed: Yes. Toleration medication: Yes. Family/Significant other contact made: No, will contact:  if patient consents to collateral contacts Patient understands  diagnosis: Yes. Discussing patient identified problems/goals with staff: Yes. Medical problems stabilized or resolved: Yes. Denies suicidal/homicidal ideation: No. Passive SI Issues/concerns per patient self-inventory: No. Other:   New problem(s) identified: None   New Short Term/Long Term Goal(s):Detox, medication stabilization, elimination of SI thoughts, development of comprehensive mental wellness plan.    Patient Goals:    Discharge Plan or Barriers: Patient recently admitted. CSW will continue to follow and assess for appropriate referrals and possible discharge planning.    Reason for Continuation of Hospitalization: Anxiety Depression Medication stabilization Suicidal ideation  Estimated Length of Stay: 3-5 days   Attendees: Patient: 01/13/2020 1:36 PM  Physician: Dr. Neita Garnet, MD 01/13/2020 1:36 PM  Nursing: Rosine Abe 01/13/2020 1:36 PM  RN Care Manager: 01/13/2020 1:36 PM  Social Worker: Radonna Ricker, LCSW 01/13/2020 1:36 PM  Recreational Therapist:  01/13/2020 1:36 PM  Other:  01/13/2020 1:36 PM  Other:  01/13/2020 1:36 PM  Other: 01/13/2020 1:36 PM    Scribe for Treatment Team: Marylee Floras, [place] 01/13/2020 1:36 PM

## 2020-01-13 NOTE — Progress Notes (Signed)
DAR NOTE: Patient presents with a sad affect and depressed mood.  Reports passive SI but verbally contracts for safety.  Described energy level as low and concentration as poor.  Rates depression at 10, hopelessness at 10, and anxiety at 10.  Maintained on routine safety checks.  Medications given as prescribed.  Support and encouragement offered as needed.  Attended group and participated.  States goal for today is "meds."  Patient visible in milieu with minimal interaction with staff and peers.  patient is safe on the unit.  Offered no complaint.

## 2020-01-14 MED ORDER — HYDROXYZINE HCL 50 MG PO TABS
50.0000 mg | ORAL_TABLET | Freq: Every day | ORAL | Status: DC
  Administered 2020-01-14: 50 mg via ORAL
  Filled 2020-01-14 (×2): qty 1

## 2020-01-14 MED ORDER — TRAZODONE HCL 100 MG PO TABS
100.0000 mg | ORAL_TABLET | Freq: Every evening | ORAL | Status: DC | PRN
  Administered 2020-01-14 – 2020-01-15 (×2): 100 mg via ORAL
  Filled 2020-01-14: qty 1
  Filled 2020-01-14: qty 7
  Filled 2020-01-14: qty 1

## 2020-01-14 MED ORDER — TRAZODONE HCL 150 MG PO TABS: 150.0000 mg | ORAL_TABLET | Freq: Every evening | ORAL | Status: DC | PRN

## 2020-01-14 NOTE — Progress Notes (Signed)
   01/14/20 0003  Psych Admission Type (Psych Patients Only)  Admission Status Voluntary  Psychosocial Assessment  Patient Complaints Anxiety;Depression  Eye Contact Fair  Facial Expression Sad  Affect Depressed;Anxious  Speech Logical/coherent  Interaction Assertive  Motor Activity Slow  Appearance/Hygiene Unremarkable  Behavior Characteristics Cooperative  Mood Depressed;Anxious  Aggressive Behavior  Effect No apparent injury  Thought Process  Coherency WDL  Content WDL  Delusions None reported or observed  Perception WDL  Hallucination None reported or observed  Judgment Poor  Confusion None  Danger to Self  Current suicidal ideation? Passive  Self-Injurious Behavior Some self-injurious ideation observed or expressed.  No lethal plan expressed   Agreement Not to Harm Self Yes  Description of Agreement verbal contract  Danger to Others  Danger to Others None reported or observed   Pt grieving the death of boyfriend in 2020-12-05from overdose. Pt endorses passive SI without plan. Pt denies HI, AVH. Reports pain 8/10 as soreness in L shoulder from flu shot. Pt anxious about changes in medication regimen and reduction in sleep medication. Pt educated on some of the symptoms she might feel from these changes. Pt verbalized understanding. Contracts for safety.
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
## 2020-01-14 NOTE — Progress Notes (Addendum)
[facility] MD Progress Note  01/14/2020 11:32 AM Kayla Silva  MRN:  [id number]  Subjective: Kayla Silva reports, "I have been in this hospital x 2 days due to worsening depression. My depression has been worsening since I lost my fiancee on November, 10th 2020 of drug overdose. I overdosed with him, but I did come around, he did not. It got a point that my medicines are no longer working. I need stabilization. The doctor stopped my previous medicines that were not working & put me on Latuda & Sertraline. I have been depressed since age 82. I'm still having thoughts of suicide, no plans or intent. I just need my Lyrica re-instated".  Objective: 44 year old female, resides in a sober residential program. Presented voluntarily due to worsening depression, suicidal ideations which she describes as passive and intermittent (currently denies suicidal plan or intention). States she feels her psychiatric medications are " no longer working the way they did". She also reports current regimen causes her to feel excessively sedated at times and " slowed". Kayla Silva is seen, chart  Reviewed. The chart findings discussed with the treatment. She presents alert, oriented & aware of situation. She says she is still feeling depressed & having passive SI. Denies any plans or intent to hurt herself or others. Patient narrated that she has been depressed since age 35, has been on medication for it. However, her fiancee's death this past 2019/12/03 worsened her depression at this time. We have slightly adjusted her medication regimen to meet her. She continues to endorse passive SI. Denies any HI, AVH, delusional thoughts or paranoia. She does not appear to be responding to any internal stimuli.   Principal Problem: Bipolar II disorder (HCC)  Diagnosis: Principal Problem:   Bipolar II disorder (HCC) Active Problems:   Alcohol dependence (HCC)   Severe recurrent major depression without psychotic features (HCC)   MDD (major  depressive disorder), recurrent episode, severe (HCC)   PTSD (post-traumatic stress disorder)  Total Time spent with patient: 25 minutes  Past Psychiatric History: See H&P  Past Medical History:  Past Medical History:  Diagnosis Date  . Bipolar 1 disorder (HCC)   . Depression     Past Surgical History:  Procedure Laterality Date  . TUBAL LIGATION     Family History: History reviewed. No pertinent family history.  Family Psychiatric  History: See H&P  Social History:  Social History   Substance and Sexual Activity  Alcohol Use Yes  . Alcohol/week: 8.0 - 10.0 standard drinks  . Types: 8 - 10 Cans of beer per week     Social History   Substance and Sexual Activity  Drug Use Yes  . Types: Marijuana, Cocaine    Social History   Socioeconomic History  . Marital status: Married    Spouse name: Not on file  . Number of children: Not on file  . Years of education: Not on file  . Highest education level: Not on file  Occupational History  . Not on file  Tobacco Use  . Smoking status: Current Every Day Smoker    Packs/day: 1.00    Types: Cigarettes  . Smokeless tobacco: Never Used  Substance and Sexual Activity  . Alcohol use: Yes    Alcohol/week: 8.0 - 10.0 standard drinks    Types: 8 - 10 Cans of beer per week  . Drug use: Yes    Types: Marijuana, Cocaine  . Sexual activity: Yes    Birth control/protection: Condom  Other Topics  Concern  . Not on file  Social History Narrative  . Not on file   Social Determinants of Health   Financial Resource Strain:   . Difficulty of Paying Living Expenses: Not on file  Food Insecurity:   . Worried About Programme researcher, broadcasting/film/video in the Last Year: Not on file  . Ran Out of Food in the Last Year: Not on file  Transportation Needs:   . Lack of Transportation (Medical): Not on file  . Lack of Transportation (Non-Medical): Not on file  Physical Activity:   . Days of Exercise per Week: Not on file  . Minutes of Exercise per  Session: Not on file  Stress:   . Feeling of Stress : Not on file  Social Connections:   . Frequency of Communication with Friends and Family: Not on file  . Frequency of Social Gatherings with Friends and Family: Not on file  . Attends Religious Services: Not on file  . Active Member of Clubs or Organizations: Not on file  . Attends Banker Meetings: Not on file  . Marital Status: Not on file   Additional Social History:    Pain Medications: None Prescriptions: zyprexa, flexeril, gabapentin, cymbalta, traxadone, prazosin Over the Counter: None History of alcohol / drug use?: Yes Longest period of sobriety (when/how long): since Nov 12, 2019 Name of Substance 1: alcohol 1 - Last Use / Amount: 11/12/2019 Name of Substance 2: cocaine 2 - Last Use / Amount: 11/12/2019  Sleep: Good  Appetite:  Good  Current Medications: Current Facility-Administered Medications  Medication Dose Route Frequency Provider Last Rate Last Admin  . acetaminophen (TYLENOL) tablet 650 mg  650 mg Oral Q6H PRN Aldean Baker, NP   650 mg at 01/13/20 2137  . alum & mag hydroxide-simeth (MAALOX/MYLANTA) 200-200-20 MG/5ML suspension 30 mL  30 mL Oral Q4H PRN Aldean Baker, NP      . gabapentin (NEURONTIN) capsule 400 mg  400 mg Oral TID Azion Centrella, Rockey Situ, MD   400 mg at 01/14/20 3893  . hydrOXYzine (ATARAX/VISTARIL) tablet 25 mg  25 mg Oral TID PRN Aldean Baker, NP   25 mg at 01/14/20 1015  . hydrOXYzine (ATARAX/VISTARIL) tablet 50 mg  50 mg Oral QHS Nwoko, Agnes I, NP      . lurasidone (LATUDA) tablet 20 mg  20 mg Oral Q breakfast Elycia Woodside, Rockey Situ, MD   20 mg at 01/14/20 0729  . magnesium hydroxide (MILK OF MAGNESIA) suspension 30 mL  30 mL Oral Daily PRN Aldean Baker, NP      . nicotine (NICODERM CQ - dosed in mg/24 hours) patch 21 mg  21 mg Transdermal Daily Jaksen Fiorella, Rockey Situ, MD   21 mg at 01/14/20 0729  . prazosin (MINIPRESS) capsule 2 mg  2 mg Oral QHS Goble Fudala, Rockey Situ, MD   2 mg at  01/13/20 2136  . sertraline (ZOLOFT) tablet 50 mg  50 mg Oral Daily Cherina Dhillon, Rockey Situ, MD   50 mg at 01/14/20 0729  . traZODone (DESYREL) tablet 100 mg  100 mg Oral QHS PRN Armandina Stammer I, NP       Lab Results:  Results for orders placed or performed during the hospital encounter of 01/12/20 (from the past 48 hour(s))  Respiratory Panel by RT PCR (Flu A&B, Covid) - Nasopharyngeal Swab     Status: None   Collection Time: 01/12/20 11:53 AM   Specimen: Nasopharyngeal Swab  Result Value Ref Range  SARS Coronavirus 2 by RT PCR NEGATIVE NEGATIVE    Comment: (NOTE) SARS-CoV-2 target nucleic acids are NOT DETECTED. The SARS-CoV-2 RNA is generally detectable in upper respiratoy specimens during the acute phase of infection. The lowest concentration of SARS-CoV-2 viral copies this assay can detect is 131 copies/mL. A negative result does not preclude SARS-Cov-2 infection and should not be used as the sole basis for treatment or other patient management decisions. A negative result may occur with  improper specimen collection/handling, submission of specimen other than nasopharyngeal swab, presence of viral mutation(s) within the areas targeted by this assay, and inadequate number of viral copies (<131 copies/mL). A negative result must be combined with clinical observations, patient history, and epidemiological information. The expected result is Negative. Fact Sheet for Patients:  https://www.moore.com/https://www.fda.gov/media/142436/download Fact Sheet for Healthcare Providers:  https://www.young.biz/https://www.fda.gov/media/142435/download This test is not yet ap proved or cleared by the [place] FDA and  has been authorized for detection and/or diagnosis of SARS-CoV-2 by FDA under an Emergency Use Authorization (EUA). This EUA will remain  in effect (meaning this test can be used) for the duration of the COVID-19 declaration under Section 564(b)(1) of the Act, 21 U.S.C. section 360bbb-3(b)(1), unless the authorization is  terminated or revoked sooner.    Influenza A by PCR NEGATIVE NEGATIVE   Influenza B by PCR NEGATIVE NEGATIVE    Comment: (NOTE) The Xpert Xpress SARS-CoV-2/FLU/RSV assay is intended as an aid in  the diagnosis of influenza from Nasopharyngeal swab specimens and  should not be used as a sole basis for treatment. Nasal washings and  aspirates are unacceptable for Xpert Xpress SARS-CoV-2/FLU/RSV  testing. Fact Sheet for Patients: https://www.moore.com/https://www.fda.gov/media/142436/download Fact Sheet for Healthcare Providers: https://www.young.biz/https://www.fda.gov/media/142435/download This test is not yet approved or cleared by the [place] FDA and  has been authorized for detection and/or diagnosis of SARS-CoV-2 by  FDA under an Emergency Use Authorization (EUA). This EUA will remain  in effect (meaning this test can be used) for the duration of the  Covid-19 declaration under Section 564(b)(1) of the Act, 21  U.S.C. section 360bbb-3(b)(1), unless the authorization is  terminated or revoked. Performed at [facility], [address] [place], [place] [address]   Pregnancy, urine     Status: None   Collection Time: 01/13/20  6:25 AM  Result Value Ref Range   Preg Test, Ur NEGATIVE NEGATIVE    Comment:        THE SENSITIVITY OF THIS METHODOLOGY IS >20 mIU/mL. Performed at [facility], [address] [place], [place] [address]   Urine rapid drug screen (hosp performed)not at [facility]     Status: None   Collection Time: 01/13/20  6:25 AM  Result Value Ref Range   Opiates NONE DETECTED NONE DETECTED   Cocaine NONE DETECTED NONE DETECTED   Benzodiazepines NONE DETECTED NONE DETECTED   Amphetamines NONE DETECTED NONE DETECTED   Tetrahydrocannabinol NONE DETECTED NONE DETECTED   Barbiturates NONE DETECTED NONE DETECTED    Comment: (NOTE) DRUG SCREEN FOR MEDICAL PURPOSES ONLY.  IF CONFIRMATION IS NEEDED FOR ANY PURPOSE, NOTIFY LAB WITHIN 5 DAYS. LOWEST DETECTABLE LIMITS FOR  URINE DRUG SCREEN Drug Class                     Cutoff (ng/mL) Amphetamine and metabolites    1000 Barbiturate and metabolites    200 Benzodiazepine                 200 Tricyclics  and metabolites     300 Opiates and metabolites        300 Cocaine and metabolites        300 THC                            50 Performed at [facility], [address] [place], [place] [address]   CBC     Status: None   Collection Time: 01/13/20  6:34 AM  Result Value Ref Range   WBC 5.6 4.0 - 10.5 K/uL   RBC 4.95 3.87 - 5.11 MIL/uL   Hemoglobin 14.2 12.0 - 15.0 g/dL   HCT 60.444.2 54.036.0 - 98.146.0 %   MCV 89.3 80.0 - 100.0 fL   MCH 28.7 26.0 - 34.0 pg   MCHC 32.1 30.0 - 36.0 g/dL   RDW 19.114.9 47.811.5 - 29.515.5 %   Platelets 299 150 - 400 K/uL   nRBC 0.0 0.0 - 0.2 %    Comment: Performed at [facility], [address] [place], [place] [address]  Comprehensive metabolic panel     Status: None   Collection Time: 01/13/20  6:34 AM  Result Value Ref Range   Sodium 138 135 - 145 mmol/L   Potassium 4.2 3.5 - 5.1 mmol/L   Chloride 107 98 - 111 mmol/L   CO2 24 22 - 32 mmol/L   Glucose, Bld 98 70 - 99 mg/dL   BUN 8 6 - 20 mg/dL   Creatinine, Ser 8.650.72 0.44 - 1.00 mg/dL   Calcium 8.9 8.9 - 78.410.3 mg/dL   Total Protein 6.9 6.5 - 8.1 g/dL   Albumin 4.0 3.5 - 5.0 g/dL   AST 15 15 - 41 U/L   ALT 13 0 - 44 U/L   Alkaline Phosphatase 77 38 - 126 U/L   Total Bilirubin 0.4 0.3 - 1.2 mg/dL   GFR calc non Af Amer >60 >60 mL/min   GFR calc Af Amer >60 >60 mL/min   Anion gap 7 5 - 15    Comment: Performed at [facility], [address] [place], [place] [address]  Hemoglobin A1c     Status: Abnormal   Collection Time: 01/13/20  6:34 AM  Result Value Ref Range   Hgb A1c MFr Bld 4.6 (L) 4.8 - 5.6 %    Comment: (NOTE) Pre diabetes:          5.7%-6.4% Diabetes:              >6.4% Glycemic control for   <7.0% adults with diabetes    Mean Plasma Glucose 85.32 mg/dL     Comment: Performed at [facility], [address] [place], [place] [address]  Ethanol     Status: None   Collection Time: 01/13/20  6:34 AM  Result Value Ref Range   Alcohol, Ethyl (B) <10 <10 mg/dL    Comment: (NOTE) Lowest detectable limit for serum alcohol is 10 mg/dL. For medical purposes only. Performed at [facility], [address] [place], [place] [address]   Lipid panel     Status: Abnormal   Collection Time: 01/13/20  6:34 AM  Result Value Ref Range   Cholesterol 193 0 - 200 mg/dL   Triglycerides 102113 <725<150 mg/dL   HDL 47 >36>40 mg/dL   Total CHOL/HDL Ratio 4.1 RATIO   VLDL 23 0 - 40 mg/dL   LDL Cholesterol 644123 (H) 0 -  99 mg/dL    Comment:        Total Cholesterol/HDL:CHD Risk Coronary Heart Disease Risk Table                     Men   Women  1/2 Average Risk   3.4   3.3  Average Risk       5.0   4.4  2 X Average Risk   9.6   7.1  3 X Average Risk  23.4   11.0        Use the calculated Patient Ratio above and the CHD Risk Table to determine the patient's CHD Risk.        ATP III CLASSIFICATION (LDL):  <100     mg/dL   Optimal  329-518  mg/dL   Near or Above                    Optimal  130-159  mg/dL   Borderline  841-660  mg/dL   High  >630     mg/dL   Very High Performed at [facility], [address], [place], [place] [address]   TSH     Status: None   Collection Time: 01/13/20  6:34 AM  Result Value Ref Range   TSH 1.713 0.350 - 4.500 uIU/mL    Comment: Performed by a 3rd Generation assay with a functional sensitivity of <=0.01 uIU/mL. Performed at [facility], [address], [place], [place] [address]    Blood Alcohol level:  Lab Results  Component Value Date   [facility] <10 01/13/2020   ETH <11 06/20/2014   Metabolic Disorder Labs: Lab Results  Component Value Date   HGBA1C 4.6 (L) 01/13/2020   MPG 85.32 01/13/2020   MPG 94 09/15/2019   No results found for: PROLACTIN Lab Results   Component Value Date   CHOL 193 01/13/2020   TRIG 113 01/13/2020   HDL 47 01/13/2020   CHOLHDL 4.1 01/13/2020   VLDL 23 01/13/2020   LDLCALC 123 (H) 01/13/2020   LDLCALC 130 (H) 09/15/2019   Physical Findings: AIMS: Facial and Oral Movements Muscles of Facial Expression: None, normal Lips and Perioral Area: None, normal Jaw: None, normal Tongue: None, normal,Extremity Movements Upper (arms, wrists, hands, fingers): None, normal Lower (legs, knees, ankles, toes): None, normal, Trunk Movements Neck, shoulders, hips: None, normal, Overall Severity Severity of abnormal movements (highest score from questions above): None, normal Incapacitation due to abnormal movements: None, normal Patient's awareness of abnormal movements (rate only patient's report): No Awareness, Dental Status Current problems with teeth and/or dentures?: Yes Does patient usually wear dentures?: Yes  CIWA:    COWS:     Musculoskeletal: Strength & Muscle Tone: within normal limits Gait & Station: normal Patient leans: N/A  Psychiatric Specialty Exam: Physical Exam  Nursing note and vitals reviewed. Constitutional: She is oriented to person, place, and time. She appears well-developed.  Cardiovascular: Normal rate.  Genitourinary:    Genitourinary Comments: Deferred   Musculoskeletal:        General: Normal range of motion.     Cervical back: Normal range of motion.  Neurological: She is alert and oriented to person, place, and time.  Skin: Skin is warm and dry.    Review of Systems  Constitutional: Negative for chills, diaphoresis and fever.  HENT: Negative for congestion, rhinorrhea, sneezing and sore throat.   Respiratory: Negative for cough, shortness of breath and wheezing.   Cardiovascular:  Negative for chest pain and palpitations.  Gastrointestinal: Negative for diarrhea, nausea and vomiting.  Genitourinary: Negative for difficulty urinating.  Musculoskeletal: Positive for back pain and  myalgias.  Allergic/Immunologic: Negative for food allergies.  Neurological: Negative for dizziness and headaches.  Psychiatric/Behavioral: Positive for dysphoric mood, sleep disturbance and suicidal ideas (Passive ideations (fleeting thoughts).). Negative for agitation, behavioral problems, confusion, decreased concentration, hallucinations and self-injury. The patient is nervous/anxious. The patient is not hyperactive.     Blood pressure 118/88, pulse (!) 109, temperature 98.4 F (36.9 C), resp. rate 16, last menstrual period 01/12/2020, SpO2 99 %.There is no height or weight on file to calculate BMI.  General Appearance: Fairly Groomed  Eye Contact:  Good  Speech:  Normal Rate  Volume:  Normal  Mood:  Reports still feeling depressed, anxious  Affect:  presents vaguely constricted   Thought Process:  Linear and Descriptions of Associations: Intact  Orientation:  Full (Time, Place, and Person)  Thought Content: Denies hallucinations, no delusions  or paranoia.  Suicidal Thoughts:  No denies suicidal or self injurious ideations, denies violent or homicidal ideations  Homicidal Thoughts: Denies  Memory: Recent and remote grossly intact  Judgement:  Fair  Insight:  Fair  Psychomotor Activity:  Reports feeling  restlessnes  Concentration:  Concentration: Good and Attention Span: Good  Recall:  Good  Fund of Knowledge:  Good  Language:  Good  Akathisia:  Negative  Handed:  Right  AIMS (if indicated):     Assets:  Communication Skills Desire for Improvement Resilience  ADL's:  Intact  Cognition:  WNL    Sleep:  Number of Hours: 6.5   Treatment Plan Summary: Daily contact with patient to assess and evaluate symptoms and progress in treatment and Medication management.  -Continue inpatient hospitalization.  -Will continue today 01/14/2020 plan as below except where it is noted.  -Mood control   -Continue Latuda 20 mg po daily with breakfast.    -Continue zoloft 50 mg po  qDay  -Anxiety  -Continue atarax 25 mg po q8h prn.  -Insomnia  -Increased Trazodone  From 50 mg to 100 mg po qhs.             -Start Hydroxyzine 50 mg po Q hs for insomnia.  -EPS              -Continue Minipress 2 mg po Q hs.  -Agitation    -Continue Gabapentin 400 mg po tid.  -Encourage participation in groups and therapeutic milieu  -Disposition planning will be ongoing  Lindell Spar, NP, PMHNP, FNP-BC 01/14/2020, 11:31 AM    Agree with NP Note
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
## 2020-01-14 NOTE — [facility] Suicide Risk Assessment (Addendum)
[facility] INPATIENT:  Family/Significant Other Suicide Prevention Education  Suicide Prevention Education:  Contact Attempts: with friend, Kayla Silva [phone]) has been identified by the patient as the family member/significant other with whom the patient will be residing, and identified as the person(s) who will aid the patient in the event of a mental health crisis.  With written consent from the patient, two attempts were made to provide suicide prevention education, prior to and/or following the patient's discharge.  We were unsuccessful in providing suicide prevention education.  A suicide education pamphlet was given to the patient to share with family/significant other.  Date and time of first attempt:01/14/2020 / 11:15am  Date and time of second attempt: Needs to be done  Kayla Silva 01/14/2020, 11:15 AM   Date and time of second attempt - done by weekday CSW, unknown exactly when  Weekday social worker also reports that she did SPE with the patient directly.  Ambrose Mantle, LCSW 01/16/2020, 9:14 AM

## 2020-01-14 NOTE — Plan of Care (Signed)
Progress note  D: pt found in bed; compliant with medication administration. Pt states they slept fair. Pt rates their depression/hopelessness/anxiety a 10/02/09 out of 10 respectively. Pt has complaints of muscle spasms. Pt has complaints of physical pain on their self inventory, but failed to state where. Pt also denied this to this Clinical research associate. Pt denies si/hi/ah/vh and verbally agrees to approach staff if these become apparent or before harming themself/others while at bhh.  A: Pt provided support and encouragement. Pt given medication per protocol and standing orders. Q19m safety checks implemented and continued.  R: Pt safe on the unit. Will continue to monitor.  Pt progressing in the following metrics  Problem: Education: Goal: Knowledge of [facility] Education information/materials will improve Outcome: Progressing Goal: Emotional status will improve Outcome: Progressing Goal: Mental status will improve Outcome: Progressing Goal: Verbalization of understanding the information provided will improve Outcome: Progressing

## 2020-01-15 LAB — CBC WITH DIFFERENTIAL/PLATELET
Abs Immature Granulocytes: 0.02 10*3/uL (ref 0.00–0.07)
Basophils Absolute: 0 10*3/uL (ref 0.0–0.1)
Basophils Relative: 0 %
Eosinophils Absolute: 0.3 10*3/uL (ref 0.0–0.5)
Eosinophils Relative: 3 %
HCT: 41 % (ref 36.0–46.0)
Hemoglobin: 13.1 g/dL (ref 12.0–15.0)
Immature Granulocytes: 0 %
Lymphocytes Relative: 28 %
Lymphs Abs: 2.6 10*3/uL (ref 0.7–4.0)
MCH: 28.3 pg (ref 26.0–34.0)
MCHC: 32 g/dL (ref 30.0–36.0)
MCV: 88.6 fL (ref 80.0–100.0)
Monocytes Absolute: 0.7 10*3/uL (ref 0.1–1.0)
Monocytes Relative: 7 %
Neutro Abs: 5.6 10*3/uL (ref 1.7–7.7)
Neutrophils Relative %: 62 %
Platelets: 311 10*3/uL (ref 150–400)
RBC: 4.63 MIL/uL (ref 3.87–5.11)
RDW: 14.6 % (ref 11.5–15.5)
WBC: 9.2 10*3/uL (ref 4.0–10.5)
nRBC: 0 % (ref 0.0–0.2)

## 2020-01-15 NOTE — Progress Notes (Signed)
Recreation Therapy Notes  Date:  1.22.21 Time: 0930 Location: [address] Group Room  Group Topic: Stress Management  Goal Area(s) Addresses:  Patient will identify positive stress management techniques. Patient will identify benefits of using stress management post d/c.  Intervention: Stress Management  Activity : Guided Imagery.  LRT read script that guided patients on a calming retreat on the beach to enjoy the peaceful waves.  Patients were to listen as script was read to engage in activity.  Education:  Stress Management, Discharge Planning.   Education Outcome: Acknowledges Education  Clinical Observations/Feedback: Pt did not attend activity.    Donelle Baba, LRT/CTRS         Kayla Silva A 01/15/2020 10:53 AM 

## 2020-01-15 NOTE — Progress Notes (Signed)
Patient reports feeling hopeful to discharge on tomorrow. No complaints of left arm pain. Safety maintained on unit with 15 min checks.   01/15/20 2025  Psych Admission Type (Psych Patients Only)  Admission Status Voluntary  Psychosocial Assessment  Patient Complaints Anxiety;Worrying  Eye Contact Fair  Facial Expression Anxious;Flat  Affect Anxious  Speech Logical/coherent  Interaction Assertive  Motor Activity Slow  Appearance/Hygiene Unremarkable  Behavior Characteristics Cooperative;Appropriate to situation  Mood Depressed;Anxious;Pleasant  Aggressive Behavior  Effect No apparent injury  Thought Process  Coherency WDL  Content WDL  Delusions None reported or observed  Perception WDL  Hallucination None reported or observed  Judgment WDL  Confusion None  Danger to Self  Current suicidal ideation? Denies  Agreement Not to Harm Self Yes  Description of Agreement verbal contract  Danger to Others  Danger to Others None reported or observed

## 2020-01-15 NOTE — Progress Notes (Addendum)
[facility] MD Progress Note  01/15/2020 1:12 PM Kayla Silva  MRN:  [id number]  Subjective: Currently patient reports feeling better than she did on admission.  As she improves she is focusing more on discharge planning and is hoping for discharge soon.  Currently does not endorse medication side effects.  Denies suicidal ideations at this time.  Of note reports some pain and erythema on left arm (deltoid area) following vaccine administration 2 days ago. Currently does not endorse medication side effects   Objective: I discussed case with treatment team and met with patient 44 year old female, resides in a sober residential program, reports history of Bipolar Disorder, PTSD, Alcohol Use Disorder, now in early remission ( last alcohol use 10/2019) . Presents voluntarily for depression, passive SI, neuro-vegetative symptoms. Attributes depression in part to her fiance's death, who passed away from an overdose in 10/2019, and to her current medication regimen not working as it had been . Also describes feeling excessively sedated and subjectively slowed on current medication regimen which consists of Neurontin ( 800 mgrs TID), Zyprexa ( 20-25 mgrs total daily dose), Minipress ( 4 mgrs QHS) , Cymbalta ( 60 mgrs daily)   Today patient presents alert, attentive, calm, in no acute distress.  Reports feeling noticeably better than she did on admission.  She currently denies suicidal ideations and presents future oriented, hopeful for discharge soon. Presents less anxious and today less focused on medications. She states " I think the Anette Guarneri is working well for me". She is visible on unit, behavior in good control,  pleasant on approach.  She denies medication side effects. Currently presents future oriented - she states she had been staying at [facility] prior to admission but states she has decided that on discharge she is going to go live with a friend instead. States this friend  will afford more support and a sober setting for her to live in. Patient reports some L deltoid area erythema and pain following vaccine 2 days ago. Examined with Priscille Loveless, NP. Has limited area of erythema, some inflammation , felt most likely to be localized reaction rather than cellulitis. Patient has no fever ( temp 98.4) , no chills, no systemic symptoms.   Principal Problem: Bipolar II disorder ([place])  Diagnosis: Principal Problem:   Bipolar II disorder ([place]) Active Problems:   Alcohol dependence ([facility])   Severe recurrent major depression without psychotic features ([place])   MDD (major depressive disorder), recurrent episode, severe ([place])   PTSD (post-traumatic stress disorder)  Total Time spent with patient: 20 minutes  Past Psychiatric History: See H&P  Past Medical History:  Past Medical History:  Diagnosis Date  . Bipolar 1 disorder ([place])   . Depression     Past Surgical History:  Procedure Laterality Date  . TUBAL LIGATION     Family History: History reviewed. No pertinent family history.  Family Psychiatric  History: See H&P  Social History:  Social History   Substance and Sexual Activity  Alcohol Use Yes  . Alcohol/week: 8.0 - 10.0 standard drinks  . Types: 8 - 10 Cans of beer per week     Social History   Substance and Sexual Activity  Drug Use Yes  . Types: Marijuana, Cocaine    Social History   Socioeconomic History  . Marital status: Married    Spouse name: Not on file  . Number of children: Not on file  . Years of education: Not on file  .  Highest education level: Not on file  Occupational History  . Not on file  Tobacco Use  . Smoking status: Current Every Day Smoker    Packs/day: 1.00    Types: Cigarettes  . Smokeless tobacco: Never Used  Substance and Sexual Activity  . Alcohol use: Yes    Alcohol/week: 8.0 - 10.0 standard drinks    Types: 8 - 10 Cans of beer per week  . Drug use: Yes    Types: Marijuana, Cocaine  . Sexual  activity: Yes    Birth control/protection: Condom  Other Topics Concern  . Not on file  Social History Narrative  . Not on file   Social Determinants of Health   Financial Resource Strain:   . Difficulty of Paying Living Expenses: Not on file  Food Insecurity:   . Worried About Charity fundraiser in the Last Year: Not on file  . Ran Out of Food in the Last Year: Not on file  Transportation Needs:   . Lack of Transportation (Medical): Not on file  . Lack of Transportation (Non-Medical): Not on file  Physical Activity:   . Days of Exercise per Week: Not on file  . Minutes of Exercise per Session: Not on file  Stress:   . Feeling of Stress : Not on file  Social Connections:   . Frequency of Communication with Friends and Family: Not on file  . Frequency of Social Gatherings with Friends and Family: Not on file  . Attends Religious Services: Not on file  . Active Member of Clubs or Organizations: Not on file  . Attends Archivist Meetings: Not on file  . Marital Status: Not on file   Additional Social History:    Pain Medications: None Prescriptions: zyprexa, flexeril, gabapentin, cymbalta, traxadone, prazosin Over the Counter: None History of alcohol / drug use?: Yes Longest period of sobriety (when/how long): since Nov 12, 2019 Name of Substance 1: alcohol 1 - Last Use / Amount: 11/12/2019 Name of Substance 2: cocaine 2 - Last Use / Amount: 11/12/2019  Sleep: Good  Appetite:  Good  Current Medications: Current Facility-Administered Medications  Medication Dose Route Frequency Provider Last Rate Last Admin  . acetaminophen (TYLENOL) tablet 650 mg  650 mg Oral Q6H PRN Connye Burkitt, NP   650 mg at 01/15/20 1225  . alum & mag hydroxide-simeth (MAALOX/MYLANTA) 200-200-20 MG/5ML suspension 30 mL  30 mL Oral Q4H PRN Connye Burkitt, NP      . gabapentin (NEURONTIN) capsule 400 mg  400 mg Oral TID Shatha Hooser, Myer Peer, MD   400 mg at 01/15/20 1202  . hydrOXYzine  (ATARAX/VISTARIL) tablet 25 mg  25 mg Oral TID PRN Connye Burkitt, NP   25 mg at 01/15/20 1226  . hydrOXYzine (ATARAX/VISTARIL) tablet 50 mg  50 mg Oral QHS Lindell Spar I, NP   50 mg at 01/14/20 2122  . lurasidone (LATUDA) tablet 20 mg  20 mg Oral Q breakfast Monquie Fulgham, Myer Peer, MD   20 mg at 01/15/20 0756  . magnesium hydroxide (MILK OF MAGNESIA) suspension 30 mL  30 mL Oral Daily PRN Connye Burkitt, NP      . nicotine (NICODERM CQ - dosed in mg/24 hours) patch 21 mg  21 mg Transdermal Daily Monay Houlton, Myer Peer, MD   21 mg at 01/15/20 0756  . prazosin (MINIPRESS) capsule 2 mg  2 mg Oral QHS Carel Schnee, Myer Peer, MD   2 mg at 01/14/20 2122  . sertraline (  ZOLOFT) tablet 50 mg  50 mg Oral Daily Hallis Meditz, Myer Peer, MD   50 mg at 01/15/20 0756  . traZODone (DESYREL) tablet 100 mg  100 mg Oral QHS PRN Lindell Spar I, NP   100 mg at 01/14/20 2123   Lab Results:  No results found for this or any previous visit (from the past 48 hour(s)). Blood Alcohol level:  Lab Results  Component Value Date   [facility] <10 01/13/2020   ETH <11 16/09/9603   Metabolic Disorder Labs: Lab Results  Component Value Date   HGBA1C 4.6 (L) 01/13/2020   MPG 85.32 01/13/2020   MPG 94 09/15/2019   No results found for: PROLACTIN Lab Results  Component Value Date   CHOL 193 01/13/2020   TRIG 113 01/13/2020   HDL 47 01/13/2020   CHOLHDL 4.1 01/13/2020   VLDL 23 01/13/2020   LDLCALC 123 (H) 01/13/2020   LDLCALC 130 (H) 09/15/2019   Physical Findings: AIMS: Facial and Oral Movements Muscles of Facial Expression: None, normal Lips and Perioral Area: None, normal Jaw: None, normal Tongue: None, normal,Extremity Movements Upper (arms, wrists, hands, fingers): None, normal Lower (legs, knees, ankles, toes): None, normal, Trunk Movements Neck, shoulders, hips: None, normal, Overall Severity Severity of abnormal movements (highest score from questions above): None, normal Incapacitation due to abnormal movements: None,  normal Patient's awareness of abnormal movements (rate only patient's report): No Awareness, Dental Status Current problems with teeth and/or dentures?: Yes Does patient usually wear dentures?: Yes  CIWA:    COWS:     Musculoskeletal: Strength & Muscle Tone: within normal limits Gait & Station: normal Patient leans: N/A  Psychiatric Specialty Exam: Physical Exam  Nursing note and vitals reviewed. Constitutional: She is oriented to person, place, and time. She appears well-developed.  Cardiovascular: Normal rate.  Genitourinary:    Genitourinary Comments: Deferred   Musculoskeletal:        General: Normal range of motion.     Cervical back: Normal range of motion.  Neurological: She is alert and oriented to person, place, and time.  Skin: Skin is warm and dry.    Review of Systems  Constitutional: Negative for chills, diaphoresis and fever.  HENT: Negative for congestion, rhinorrhea, sneezing and sore throat.   Respiratory: Negative for cough, shortness of breath and wheezing.   Cardiovascular: Negative for chest pain and palpitations.  Gastrointestinal: Negative for diarrhea, nausea and vomiting.  Genitourinary: Negative for difficulty urinating.  Musculoskeletal: Positive for back pain and myalgias.  Allergic/Immunologic: Negative for food allergies.  Neurological: Negative for dizziness and headaches.  Psychiatric/Behavioral: Positive for dysphoric mood, sleep disturbance and suicidal ideas (Passive ideations (fleeting thoughts).). Negative for agitation, behavioral problems, confusion, decreased concentration, hallucinations and self-injury. The patient is nervous/anxious. The patient is not hyperactive.   no chest pain , no shortness of breath, no vomiting , no fever, no chills   Blood pressure 103/82, pulse (!) 109, temperature 98.4 F (36.9 C), resp. rate 18, last menstrual period 01/12/2020, SpO2 100 %.There is no height or weight on file to calculate BMI.  General  Appearance: improved grooming  Eye Contact:  Good  Speech:  Normal Rate  Volume:  Normal  Mood:  reports feeling better today and describes mood as 7/10 with 10 being best   Affect:  presents more reactive, fuller in range, not irritable at this time   Thought Process:  Linear and Descriptions of Associations: Intact  Orientation:  Full (Time, Place, and Person)  Thought Content: Denies hallucinations,  no delusions  , not internally preoccupied   Suicidal Thoughts:  No/ denies suicidal or self injurious ideations, denies violent or homicidal ideations  Homicidal Thoughts: Denies  Memory: Recent and remote grossly intact  Judgement:  improving   Insight:  Fair/ improving  Psychomotor Activity:  Normal   Concentration:  Concentration: Good and Attention Span: Good  Recall:  Good  Fund of Knowledge:  Good  Language:  Good  Akathisia:  Negative  Handed:  Right  AIMS (if indicated):     Assets:  Communication Skills Desire for Improvement Resilience  ADL's:  Intact  Cognition:  WNL    Sleep:  Number of Hours: 7   Assessment -  44 year old female, resides in a sober residential program, reports history of Bipolar Disorder, PTSD, Alcohol Use Disorder, now in early remission ( last alcohol use 10/2019) . Presents voluntarily for depression, passive SI, neuro-vegetative symptoms. Attributes depression in part to her fiance's death, who passed away from an overdose in 10/2019, and to her current medication regimen not working as it had been . Also describes feeling excessively sedated and subjectively slowed on current medication regimen which consists of Neurontin ( 800 mgrs TID), Zyprexa ( 20-25 mgrs total daily dose), Minipress ( 4 mgrs QHS) , Cymbalta ( 60 mgrs daily)   Today patient presenting with improved mood and exhibiting a noticeably improved range of affect . Denies suicidal ideations and presents future oriented, planning on moving in with a friend following discharge, rather than  returning to residential setting she had been living in prior to admission. Localized erythema following deltoid vaccine 2 days ago. No fever, no chills, no systemic symptoms. Reviewed /examined with FNP and considered more likely to be localized reaction than infection. Will treat locally and monitor . Will order CBC . * 1/20 EKG QTc 466 Treatment Plan Summary: Daily contact with patient to assess and evaluate symptoms and progress in treatment and Medication management. Treatment Plan reviewed as below today 1/22  Encourage group and milieu participation  Encourage efforts to work on sobriety and relapse prevention Continue Latuda 20 mg po daily for mood disorder  Continue Zoloft 50 mg po daily for anxiety, depression Continue Atarax 25 mg po q8h prn for anxiety as needed  Continue Trazodone 100 mg po qhs prn for insomnia  Continue Minipress 2 mg po qhs for nightmares  Continue  Gabapentin 400 mg po tid for anxiety and pain Treatment Team working on disposition planning as above.  Recheck EKG to monitor QTc   Jenne Campus, MD 01/15/2020, 1:12 PM   Patient ID: Kayla Silva, female   DOB: Oct 27, 1976, 44 y.o.   MRN: [id number]

## 2020-01-15 NOTE — Progress Notes (Signed)
   01/15/20 2025  COVID-19 Daily Checkoff  Have you had a fever (temp > 37.80C/100F)  in the past 24 hours?  No  If you have had runny nose, nasal congestion, sneezing in the past 24 hours, has it worsened? No  COVID-19 EXPOSURE  Have you traveled outside the state in the past 14 days? No  Have you been in contact with someone with a confirmed diagnosis of COVID-19 or PUI in the past 14 days without wearing appropriate PPE? No  Have you been living in the same home as a person with confirmed diagnosis of COVID-19 or a PUI (household contact)? No  Have you been diagnosed with COVID-19? No

## 2020-01-15 NOTE — Progress Notes (Signed)
D: Pt denied AVH/HI/SI.  Pt stated that her anxiety and depression haven gotten much better since her admission to [facility].    A: Emotional support and encouragement provided.  RN Monitored pt's labs and vitals.  Assessed for needs/concerns. Administered medications per MD orders. Maintained safety through q 15 min checks.  R: Pt was observed smiling and interacting well  with other pt's on the unit. No adverse reactions to medications were noted.  Pt remains safe on the unit.  RN will continue to provide support and assist pt as needed.

## 2020-01-15 NOTE — Progress Notes (Signed)
In an effort to receive an appointment for medication management for the patient, with [facility], left voicemail for a return call on 01/14/20 and also with two different people on 01/15/20.

## 2020-01-16 MED ORDER — LURASIDONE HCL 20 MG PO TABS: 20.0000 mg | ORAL_TABLET | Freq: Every day | ORAL | 0 refills | Status: DC

## 2020-01-16 MED ORDER — PRAZOSIN HCL 2 MG PO CAPS: 2.0000 mg | ORAL_CAPSULE | Freq: Every day | ORAL | 0 refills | Status: AC

## 2020-01-16 MED ORDER — SERTRALINE HCL 50 MG PO TABS: 50.0000 mg | ORAL_TABLET | Freq: Every day | ORAL | 0 refills | Status: DC

## 2020-01-16 MED ORDER — GABAPENTIN 400 MG PO CAPS: 400.0000 mg | ORAL_CAPSULE | Freq: Three times a day (TID) | ORAL | 0 refills | Status: DC

## 2020-01-16 MED ORDER — HYDROXYZINE HCL 25 MG PO TABS: 25.0000 mg | ORAL_TABLET | Freq: Three times a day (TID) | ORAL | 0 refills | Status: DC | PRN

## 2020-01-16 MED ORDER — TRAZODONE HCL 100 MG PO TABS: 100.0000 mg | ORAL_TABLET | Freq: Every evening | ORAL | 0 refills | Status: DC | PRN

## 2020-01-16 MED ORDER — NICOTINE 21 MG/24HR TD PT24: 21.0000 mg | MEDICATED_PATCH | Freq: Every day | TRANSDERMAL | 0 refills | Status: DC

## 2020-01-16 NOTE — Progress Notes (Signed)
Pt discharged to lobby. Pt was stable and appreciative at that time. All papers and prescriptions were given and valuables returned. Verbal understanding expressed. Denies SI/HI and A/VH. Pt given opportunity to express concerns and ask questions.  

## 2020-01-16 NOTE — Progress Notes (Signed)
D. Pt presents as calm and cooperative, reports that she is looking forward to discharge today. Per pt's self inventory, pt rated her depression, hopelessness and anxiety a 4/3/4, respectively. Pt currently denies SI/HI and AVH  A. Labs and vitals monitored. Pt compliant with medications. Pt supported emotionally and encouraged to express concerns and ask questions.   R. Pt remains safe with 15 minute checks. Will continue POC.

## 2020-01-16 NOTE — Progress Notes (Signed)
  [facility] Adult Case Management Discharge Plan :  Will you be returning to the same living situation after discharge:  No.  Going to a new recovery home At discharge, do you have transportation home?: No.  Needs assistance from hospital Do you have the ability to pay for your medications: No.  No insurance, is being sent to a local agency for assistance  Release of information consent forms completed and emailed to Medical Records, then turned in to Medical Records by CSW.   Patient to Follow up at: Follow-up Information    [facility]) Follow up.   Why: Your appointment for medication management w/Mary Lourena Simmonds Contact information: [address] [place], [place] [address] P:  [phone]        [facility] Go on 02/03/2020.   Specialty: Professional Counselor Why: Your appointment for therapy with Thyra Breed is scheduled for 02/03/20 at 1:00 pm.  This appointment will be held in person.  Please have your insurance information and your discharge summ Contact information: [address] [place] [address] [phone]        Lavinia Sharps, NP Follow up.   Why: Please attend your next appointment with this provider at the [facility]. Contact information: [address] [place] [address] [phone]           Next level of care provider has access to [facility] Link:no  Safety Planning and Suicide Prevention discussed: No.  Patient declined so this was done with her     Has patient been referred to the Quitline?: Patient refused referral  Patient has been referred for addiction treatment: Yes  Lynnell Chad, LCSW 01/16/2020, 11:07 AM

## 2020-01-16 NOTE — BHH Suicide Risk Assessment (Signed)
[facility] Discharge Suicide Risk Assessment   Principal Problem: Bipolar II disorder [facility]) Discharge Diagnoses: Principal Problem:   Bipolar II disorder (HCC) Active Problems:   Alcohol dependence (HCC)   Severe recurrent major depression without psychotic features (HCC)   MDD (major depressive disorder), recurrent episode, severe (HCC)   PTSD (post-traumatic stress disorder)   Total Time spent with patient: 30 minutes  Musculoskeletal: Strength & Muscle Tone: within normal limits Gait & Station: normal Patient leans: N/A  Psychiatric Specialty Exam: Review of Systems denies headache, no chest pain, no shortness of breath. No fever , no chills. Reports decreased discomfort and improved area of erythema on arm/deltoid area .  Blood pressure 123/82, pulse 89, temperature 98 F (36.7 C), temperature source Oral, resp. rate 18, last menstrual period 01/12/2020, SpO2 100 %.There is no height or weight on file to calculate BMI.  General Appearance: improved grooming   Eye Contact::  Good  Speech:  Normal Rate409  Volume:  Normal  Mood:  reports feeling " a lot better", and at this time presents euthymic, with a full range of affect   Affect:  reactive, appropriate   Thought Process:  Linear and Descriptions of Associations: Intact  Orientation:  Other:  fully alert and attentive  Thought Content:  no hallucinations, no delusions, not internally preoccupied   Suicidal Thoughts:  No denies suicidal or self injurious ideations, denies homicidal or violent ideations  Homicidal Thoughts:  No  Memory:  recent and remote grossly intact   Judgement:  Other:  improving  Insight:  improving   Psychomotor Activity:  Normal- no psychomotor restlessness or agitation  Concentration:  Good  Recall:  Good  Fund of Knowledge:Good  Language: Good  Akathisia:  Negative  Handed:  Right  AIMS (if indicated):   no abnormal movements noted or reported   Assets:  Communication Skills Desire for  Improvement Resilience  Sleep:  Number of Hours: 7  Cognition: WNL  ADL's:  Intact   Mental Status Per Nursing Assessment::   On Admission:  Suicidal ideation indicated by patient, Self-harm behaviors  Demographic Factors:  43, separated, has two children, was living in a Recovery House prior to admission  Loss Factors: Death of SO last year   Historical Factors: History of prior psychiatric admissions, history of depression and of suicide attempt by overdosing as a teenager , history of PTSD , history of alcohol use disorder  Risk Reduction Factors:   Sense of responsibility to family, Living with another person, especially a relative and Positive coping skills or problem solving skills  Continued Clinical Symptoms:  At this time patient presents alert, attentive, well groomed, good eye conctat. States she is feeling significantly better than on admission and today mood presents euthymic with a reactive/appropriate range of affect. Denies suicidal or self injurious ideations, no HI. No psychotic symptoms . Behavior on unit calm and in good control at this time. Pleasant on approach. Currently does not endorse medication side effects. * Patient had area of erythema and induration on left arm/deltoid area following vaccine administration two days prior. At this time erythema is much improved and area has reduced . She states it is no longer hurting, and does not appear to be in any acute distress or discomfort  . 1/22 CBC WBC 9.2, differential within normal. !/22 EKG NSR QTc 462.   Cognitive Features That Contribute To Risk:  No gross cognitive deficits noted upon discharge. Is alert , attentive, and oriented x 3   Suicide  Risk:  Mild:  Suicidal ideation of limited frequency, intensity, duration, and specificity.  There are no identifiable plans, no associated intent, mild dysphoria and related symptoms, good self-control (both objective and subjective assessment), few other risk  factors, and identifiable protective factors, including available and accessible social support.  Follow-up [facility]) Follow up.   Why: Your appointment for medication management w/Mary Jodie Echevaria Contact information: [address] [place], [place] [address] P:  [place] Go on 02/03/2020.   Specialty: Professional Counselor Why: Your appointment for therapy with Joycelyn Schmid is scheduled for 02/03/20 at 1:00 pm.  This appointment will be held in person.  Please have your insurance information and your discharge summ Contact information: [place] [address] [phone]        Marliss Coots, NP Follow up.   Why: Please attend your next appointment with this provider at the [facility]. Contact information: [place] [address] [phone]           Plan Of Care/Follow-up recommendations:  Activity:  as tolerated  Diet:  regular  Tests:  NA Other:  see below  Patient is expressing readiness for discharge and is leaving unit in good spirits . Follow up as above   Jenne Campus, MD 01/16/2020, 9:50 AM

## 2020-01-16 NOTE — BHH Group Notes (Signed)
LCSW Group Therapy Note  01/16/2020   10:00-11:00am   Type of Therapy and Topic:  Group Therapy: Anger Cues and Responses  Participation Level:  Active   Description of Group:   In this group, patients learned how to recognize the physical, cognitive, emotional, and behavioral responses they have to anger-provoking situations.  They identified a recent time they became angry and how they reacted.  They analyzed how their reaction was possibly beneficial and how it was possibly unhelpful.  The group discussed a variety of healthier coping skills that could help with such a situation in the future.  Focus was placed on how helpful it is to recognize the underlying emotions to our anger, because working on those can lead to a more permanent solution as well as our ability to focus on the important rather than the urgent.  Therapeutic Goals: 1. Patients will remember their last incident of anger and how they felt emotionally and physically, what their thoughts were at the time, and how they behaved. 2. Patients will identify how their behavior at that time worked for them, as well as how it worked against them. 3. Patients will explore possible new behaviors to use in future anger situations. 4. Patients will learn that anger itself is normal and cannot be eliminated, and that healthier reactions can assist with resolving conflict rather than worsening situations.  Summary of Patient Progress:  The patient shared that her most recent time of anger was 2 days ago and said her feelings was caused by getting the news from her recovery home that they have made the decision not to allow smoking in the home, which essentially meant she could not return there to live at discharge from this hospital.  She stated that she had thoughts that they were just trying to make her leave.  She got angry at first and wanted to tell them how mad she was, but after thinking about it she realized there are many more places  to go, so she ended up finding herself another program.  Therapeutic Modalities:   Cognitive Behavioral Therapy  Lynnell Chad
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
## 2020-01-24 ENCOUNTER — Other Ambulatory Visit: Payer: Self-pay

## 2020-01-24 ENCOUNTER — Observation Stay (HOSPITAL_COMMUNITY)
Admission: AD | Admit: 2020-01-24 | Discharge: 2020-01-25 | Disposition: A | Discharge status: Home / Self Care | Payer: Self-pay | Source: Intra-hospital | Attending: Psychiatry | Admitting: Psychiatry

## 2020-01-24 ENCOUNTER — Emergency Department (HOSPITAL_COMMUNITY)
Admission: EM | Admit: 2020-01-24 | Discharge: 2020-01-24 | Disposition: A | Discharge status: Other Acute Inpatient Hospital | Payer: Self-pay | Attending: Emergency Medicine | Admitting: Emergency Medicine

## 2020-01-24 DIAGNOSIS — Z20822 Contact with and (suspected) exposure to covid-19: Secondary | ICD-10-CM | POA: Insufficient documentation

## 2020-01-24 DIAGNOSIS — Z5900 Homelessness unspecified: Secondary | ICD-10-CM

## 2020-01-24 DIAGNOSIS — Z79899 Other long term (current) drug therapy: Secondary | ICD-10-CM | POA: Insufficient documentation

## 2020-01-24 DIAGNOSIS — T50996A Underdosing of other drugs, medicaments and biological substances, initial encounter: Secondary | ICD-10-CM | POA: Insufficient documentation

## 2020-01-24 DIAGNOSIS — F1020 Alcohol dependence, uncomplicated: Secondary | ICD-10-CM | POA: Diagnosis present

## 2020-01-24 DIAGNOSIS — F17210 Nicotine dependence, cigarettes, uncomplicated: Secondary | ICD-10-CM | POA: Insufficient documentation

## 2020-01-24 DIAGNOSIS — F419 Anxiety disorder, unspecified: Secondary | ICD-10-CM | POA: Insufficient documentation

## 2020-01-24 DIAGNOSIS — Z91128 Patient's intentional underdosing of medication regimen for other reason: Secondary | ICD-10-CM | POA: Insufficient documentation

## 2020-01-24 DIAGNOSIS — F4310 Post-traumatic stress disorder, unspecified: Secondary | ICD-10-CM | POA: Diagnosis present

## 2020-01-24 DIAGNOSIS — F314 Bipolar disorder, current episode depressed, severe, without psychotic features: Secondary | ICD-10-CM | POA: Insufficient documentation

## 2020-01-24 DIAGNOSIS — Z560 Unemployment, unspecified: Secondary | ICD-10-CM | POA: Insufficient documentation

## 2020-01-24 DIAGNOSIS — Z915 Personal history of self-harm: Secondary | ICD-10-CM | POA: Insufficient documentation

## 2020-01-24 DIAGNOSIS — F319 Bipolar disorder, unspecified: Secondary | ICD-10-CM | POA: Diagnosis present

## 2020-01-24 DIAGNOSIS — Z818 Family history of other mental and behavioral disorders: Secondary | ICD-10-CM | POA: Insufficient documentation

## 2020-01-24 DIAGNOSIS — R45851 Suicidal ideations: Secondary | ICD-10-CM | POA: Insufficient documentation

## 2020-01-24 DIAGNOSIS — F3130 Bipolar disorder, current episode depressed, mild or moderate severity, unspecified: Principal | ICD-10-CM | POA: Insufficient documentation

## 2020-01-24 DIAGNOSIS — T43226A Underdosing of selective serotonin reuptake inhibitors, initial encounter: Secondary | ICD-10-CM | POA: Insufficient documentation

## 2020-01-24 LAB — RESPIRATORY PANEL BY RT PCR (FLU A&B, COVID)
Influenza A by PCR: NEGATIVE
Influenza B by PCR: NEGATIVE
SARS Coronavirus 2 by RT PCR: NEGATIVE

## 2020-01-24 LAB — COMPREHENSIVE METABOLIC PANEL
ALT: 12 U/L (ref 0–44)
AST: 15 U/L (ref 15–41)
Albumin: 3.9 g/dL (ref 3.5–5.0)
Alkaline Phosphatase: 75 U/L (ref 38–126)
Anion gap: 8 (ref 5–15)
BUN: 6 mg/dL (ref 6–20)
CO2: 24 mmol/L (ref 22–32)
Calcium: 9.1 mg/dL (ref 8.9–10.3)
Chloride: 104 mmol/L (ref 98–111)
Creatinine, Ser: 0.68 mg/dL (ref 0.44–1.00)
GFR calc Af Amer: >60 mL/min (ref >60)
GFR calc non Af Amer: >60 mL/min (ref >60)
Glucose, Bld: 103 mg/dL — ABNORMAL HIGH (ref 70–99)
Potassium: 3.6 mmol/L (ref 3.5–5.1)
Sodium: 136 mmol/L (ref 135–145)
Total Bilirubin: 0.3 mg/dL (ref 0.3–1.2)
Total Protein: 7.1 g/dL (ref 6.5–8.1)

## 2020-01-24 LAB — RAPID URINE DRUG SCREEN, HOSP PERFORMED
Amphetamines: NOT DETECTED
Barbiturates: NOT DETECTED
Benzodiazepines: NOT DETECTED
Cocaine: NOT DETECTED
Opiates: NOT DETECTED
Tetrahydrocannabinol: NOT DETECTED

## 2020-01-24 LAB — CBC
HCT: 43.1 % (ref 36.0–46.0)
Hemoglobin: 14 g/dL (ref 12.0–15.0)
MCH: 28.6 pg (ref 26.0–34.0)
MCHC: 32.5 g/dL (ref 30.0–36.0)
MCV: 88 fL (ref 80.0–100.0)
Platelets: 428 10*3/uL — ABNORMAL HIGH (ref 150–400)
RBC: 4.9 MIL/uL (ref 3.87–5.11)
RDW: 14.1 % (ref 11.5–15.5)
WBC: 10.1 10*3/uL (ref 4.0–10.5)
nRBC: 0 % (ref 0.0–0.2)

## 2020-01-24 LAB — SALICYLATE LEVEL: Salicylate Lvl: <7 mg/dL — ABNORMAL LOW (ref 7.0–30.0)

## 2020-01-24 LAB — ETHANOL: Alcohol, Ethyl (B): <10 mg/dL (ref <10)

## 2020-01-24 LAB — ACETAMINOPHEN LEVEL: Acetaminophen (Tylenol), Serum: <10 ug/mL — ABNORMAL LOW (ref 10–30)

## 2020-01-24 LAB — I-STAT BETA HCG BLOOD, ED (MC, WL, AP ONLY): I-stat hCG, quantitative: <5 m[IU]/mL (ref <5)

## 2020-01-24 MED ORDER — NICOTINE 21 MG/24HR TD PT24: 21.0000 mg | MEDICATED_PATCH | Freq: Every day | TRANSDERMAL | Status: DC

## 2020-01-24 MED ORDER — SERTRALINE HCL 50 MG PO TABS: 50.0000 mg | ORAL_TABLET | Freq: Every day | ORAL | Status: DC

## 2020-01-24 MED ORDER — HYDROXYZINE HCL 25 MG PO TABS: 25.0000 mg | ORAL_TABLET | Freq: Three times a day (TID) | ORAL | Status: DC | PRN

## 2020-01-24 MED ORDER — TRAZODONE HCL 100 MG PO TABS: 100.0000 mg | ORAL_TABLET | Freq: Every evening | ORAL | Status: DC | PRN

## 2020-01-24 MED ORDER — GABAPENTIN 400 MG PO CAPS
400.0000 mg | ORAL_CAPSULE | Freq: Three times a day (TID) | ORAL | Status: DC
  Administered 2020-01-24: 21:00:00 400 mg via ORAL
  Filled 2020-01-24: qty 1

## 2020-01-24 MED ORDER — GABAPENTIN 400 MG PO CAPS
400.0000 mg | ORAL_CAPSULE | Freq: Three times a day (TID) | ORAL | Status: DC
  Administered 2020-01-25: 08:00:00 400 mg via ORAL
  Filled 2020-01-24: qty 4

## 2020-01-24 MED ORDER — PRAZOSIN HCL 1 MG PO CAPS
2.0000 mg | ORAL_CAPSULE | Freq: Every day | ORAL | Status: DC
  Administered 2020-01-24: 21:00:00 2 mg via ORAL
  Filled 2020-01-24: qty 2

## 2020-01-24 MED ORDER — NICOTINE 21 MG/24HR TD PT24
21.0000 mg | MEDICATED_PATCH | Freq: Every day | TRANSDERMAL | Status: DC
  Administered 2020-01-25: 08:00:00 21 mg via TRANSDERMAL
  Filled 2020-01-24: qty 1

## 2020-01-24 MED ORDER — TRAZODONE HCL 100 MG PO TABS
100.0000 mg | ORAL_TABLET | Freq: Every evening | ORAL | Status: DC | PRN
  Administered 2020-01-25: 01:00:00 100 mg via ORAL
  Filled 2020-01-24: qty 1

## 2020-01-24 MED ORDER — LURASIDONE HCL 20 MG PO TABS
20.0000 mg | ORAL_TABLET | Freq: Every day | ORAL | Status: DC
  Filled 2020-01-24: qty 1

## 2020-01-24 MED ORDER — PRAZOSIN HCL 1 MG PO CAPS: 2.0000 mg | ORAL_CAPSULE | Freq: Every day | ORAL | Status: DC

## 2020-01-24 MED ORDER — SERTRALINE HCL 50 MG PO TABS
50.0000 mg | ORAL_TABLET | Freq: Every day | ORAL | Status: DC
  Administered 2020-01-24: 21:00:00 50 mg via ORAL
  Filled 2020-01-24: qty 1

## 2020-01-24 MED ORDER — HYDROXYZINE HCL 25 MG PO TABS: 25.0000 mg | ORAL_TABLET | Freq: Once | ORAL | Status: DC

## 2020-01-24 NOTE — ED Notes (Signed)
Pt was given purple scrubs to dress out and was asked to provide urine specimen.

## 2020-01-24 NOTE — ED Notes (Signed)
Pt is sleeping,  Will hold vistaril and nicotine patch until needed.
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
## 2020-01-24 NOTE — ED Notes (Addendum)
4 Pt Belonging bags located In [facility] Day Room (1 large black duffle bag, 1 multicolored bag, 1Pt belonging Bag and 1 yellow purse)

## 2020-01-24 NOTE — ED Provider Notes (Signed)
[facility]-EMERGENCY DEPT Provider Note   CSN: [id number] Arrival date & time: 01/24/20  1658     History Chief Complaint  Patient presents with  . Suicidal    Kayla Silva is a 44 y.o. female.  HPI      Kayla Silva is a 44 y.o. female, with a history of bipolar, presenting to the ED with suicidal ideations beginning earlier today.  States she is grieving from the overdose death of her fianc 2023-11-13.  She states they both overdosed, but she survived and he did not.   Plan: States, "I was planning on overdosing on my medications.  I've done it before.  It was all I could do to stop myself from taking a bunch of my medications and instead come here."  Support system: States her mother is supportive, however, lives in [place].  Previous Attempts: Endorses previous suicide attempts by overdose on prescription medications.  Drug/alcohol use: States she has not used heroin since the overdose of her fianc 11-13-2023.  Denies other illicit drug use.  Denies alcohol use.    Medication compliance: States she did not like how the Jordan was making her feel so she began taking her Zyprexa again. She voices compliance with her other prescribed medications with last dose of each medication occurring yesterday.  Medication changes: States she was recently changed to Jordan.  Physical complaints: States she feels anxious, however, denies any other complaints.       Past Medical History:  Diagnosis Date  . Bipolar 1 disorder (HCC)   . Depression     Patient Active Problem List   Diagnosis Date Noted  . PTSD (post-traumatic stress disorder)   . MDD (major depressive disorder), recurrent episode, severe (HCC) 01/12/2020  . Severe recurrent major depression without psychotic features (HCC) 09/15/2019  . Alcohol dependence (HCC) 06/28/2014  . Bipolar II disorder (HCC) 06/21/2014    Past Surgical History:  Procedure Laterality  Date  . TUBAL LIGATION       OB History   No obstetric history on file.     No family history on file.  Social History   Tobacco Use  . Smoking status: Current Every Day Smoker    Packs/day: 1.00    Types: Cigarettes  . Smokeless tobacco: Never Used  Substance Use Topics  . Alcohol use: Yes    Alcohol/week: 8.0 - 10.0 standard drinks    Types: 8 - 10 Cans of beer per week  . Drug use: Yes    Types: Marijuana, Cocaine    Home Medications Prior to Admission medications   Medication Sig Start Date End Date Taking? Authorizing Provider  gabapentin (NEURONTIN) 400 MG capsule Take 1 capsule (400 mg total) by mouth 3 (three) times daily. 01/16/20   Aldean Baker, NP  hydrOXYzine (ATARAX/VISTARIL) 25 MG tablet Take 1 tablet (25 mg total) by mouth 3 (three) times daily as needed for anxiety. 01/16/20   Aldean Baker, NP  lurasidone (LATUDA) 20 MG TABS tablet Take 1 tablet (20 mg total) by mouth daily with breakfast. 01/17/20   Aldean Baker, NP  nicotine (NICODERM CQ - DOSED IN MG/24 HOURS) 21 mg/24hr patch Place 1 patch (21 mg total) onto the skin daily. 01/17/20   Aldean Baker, NP  prazosin (MINIPRESS) 2 MG capsule Take 1 capsule (2 mg total) by mouth at bedtime. 01/16/20   Aldean Baker, NP  sertraline (ZOLOFT) 50 MG tablet Take 1  tablet (50 mg total) by mouth daily. 01/17/20   Aldean Baker, NP  traZODone (DESYREL) 100 MG tablet Take 1 tablet (100 mg total) by mouth at bedtime as needed for sleep. 01/16/20   Aldean Baker, NP  rOPINIRole (REQUIP) 0.5 MG tablet Take 1 tablet (0.5 mg total) by mouth 3 (three) times daily. Patient not taking: Reported on 09/15/2019 06/28/14 10/08/19  Beau Fanny, FNP    Allergies    Haldol [haloperidol lactate], Lithium, Valproic acid, and Wellbutrin [bupropion]  Review of Systems   Review of Systems  Constitutional: Negative for chills, diaphoresis and fever.  Respiratory: Negative for cough and shortness of breath.   Cardiovascular:  Negative for chest pain.  Gastrointestinal: Negative for abdominal pain, diarrhea, nausea and vomiting.  Neurological: Negative for syncope and weakness.  Psychiatric/Behavioral: Positive for dysphoric mood and suicidal ideas. Negative for hallucinations. The patient is nervous/anxious.   All other systems reviewed and are negative.   Physical Exam Updated Vital Signs BP (!) 162/80 (BP Location: Right Arm)   Pulse (!) 114   Temp 97.9 F (36.6 C) (Oral)   Resp 16   LMP 01/12/2020   SpO2 97%   Physical Exam Vitals and nursing note reviewed.  Constitutional:      General: She is not in acute distress.    Appearance: She is well-developed. She is not diaphoretic.  HENT:     Head: Normocephalic and atraumatic.     Mouth/Throat:     Mouth: Mucous membranes are moist.     Pharynx: Oropharynx is clear.  Eyes:     Conjunctiva/sclera: Conjunctivae normal.  Cardiovascular:     Rate and Rhythm: Regular rhythm. Tachycardia present.     Pulses: Normal pulses.     Comments: Mildly tachycardic. Pulmonary:     Effort: Pulmonary effort is normal. No respiratory distress.  Abdominal:     Tenderness: There is no guarding.  Musculoskeletal:     Cervical back: Neck supple.     Right lower leg: No edema.     Left lower leg: No edema.  Skin:    General: Skin is warm and dry.  Neurological:     Mental Status: She is alert.  Psychiatric:        Mood and Affect: Mood is anxious. Affect is labile and tearful.        Speech: Speech normal.     Comments: Normal conversational tempo.  Intermittently tearful.     ED Results / Procedures / Treatments   Labs (all labs ordered are listed, but only abnormal results are displayed) Labs Reviewed  COMPREHENSIVE METABOLIC PANEL - Abnormal; Notable for the following components:      Result Value   Glucose, Bld 103 (*)    All other components within normal limits  SALICYLATE LEVEL - Abnormal; Notable for the following components:   Salicylate Lvl  <7.0 (*)    All other components within normal limits  ACETAMINOPHEN LEVEL - Abnormal; Notable for the following components:   Acetaminophen (Tylenol), Serum <10 (*)    All other components within normal limits  CBC - Abnormal; Notable for the following components:   Platelets 428 (*)    All other components within normal limits  RESPIRATORY PANEL BY RT PCR (FLU A&B, COVID)  ETHANOL  RAPID URINE DRUG SCREEN, HOSP PERFORMED  I-STAT BETA HCG BLOOD, ED ([facility], [facility], [facility] ONLY)    EKG None  Radiology No results found.  Procedures Procedures (including critical care time)  Medications Ordered in ED Medications  nicotine (NICODERM CQ - dosed in mg/24 hours) patch 21 mg (0 mg Transdermal Hold 01/24/20 1832)  hydrOXYzine (ATARAX/VISTARIL) tablet 25 mg (0 mg Oral Hold 01/24/20 1832)  gabapentin (NEURONTIN) capsule 400 mg (400 mg Oral Given 01/24/20 2120)  hydrOXYzine (ATARAX/VISTARIL) tablet 25 mg (has no administration in time range)  sertraline (ZOLOFT) tablet 50 mg (50 mg Oral Given 01/24/20 2120)  prazosin (MINIPRESS) capsule 2 mg (2 mg Oral Given 01/24/20 2120)  traZODone (DESYREL) tablet 100 mg (has no administration in time range)    ED Course  I have reviewed the triage vital signs and the nursing notes.  Pertinent labs & imaging results that were available during my care of the patient were reviewed by me and considered in my medical decision making (see chart for details).    MDM Rules/Calculators/A&P                        Patient presents complaining of suicidal ideations.  Patient medically cleared.  Recommendation for observation at [facility]. Charge nurse coordinated transfer.  Home medications ordered except for her Latuda.  Since the patient states she was not comfortable taking this medication, I will defer the continuation of this medication, or initiation of an alternative, to the psych team.   SAD PERSONS scale S - Sex:  1 if female; 0 if female; (more females attempt, more  males succeed) A - Age: 69 if < 20 or > 23 D - Depression: 1 if depression is present P - Previous attempt: 1 if present E - Ethanol abuse: 1 if present R - Rational thinking loss (psychosis): 1 if present S - Social Supports Lacking: 1 if present O - Organized Plan: 1 if plan is made and lethal N - No Spouse: 1 if divorced, widowed, separated, or single S - Sickness: 1 if chronic, debilitating, and severe  0-2 Send home with followup 3-4 Close follow up; consider hospitalization 5-6 Strongly consider hospitalization, depending on confidence in follow up arrangement 7-10 Hospitalize or commit  This patient has a SAD PERSONS score of: 5  Vitals:   01/24/20 1705 01/24/20 2101  BP: (!) 162/80 106/65  Pulse: (!) 114 92  Resp: 16 15  Temp: 97.9 F (36.6 C) 97.6 F (36.4 C)  TempSrc: Oral Oral  SpO2: 97% 95%     Final Clinical Impression(s) / ED Diagnoses Final diagnoses:  Suicidal ideations    Rx / DC Orders ED Discharge Orders    None       Layla Maw 01/24/20 2126    Lennice Sites, DO 01/24/20 2131

## 2020-01-24 NOTE — Progress Notes (Signed)
Received Kayla Silva at the change of shift awake in her room with the sitter at the bedside. She endorsed feeling tired with passive SI. She was compliant with her medications. Report was called to nurse Doris at 2300 hrs. She was transported via Psychologist, educational at [employer] hrs without incident with her personal belongings.
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
## 2020-01-25 ENCOUNTER — Encounter (HOSPITAL_COMMUNITY): Payer: Self-pay | Admitting: Nurse Practitioner

## 2020-01-25 ENCOUNTER — Ambulatory Visit (HOSPITAL_COMMUNITY): Payer: Self-pay

## 2020-01-25 ENCOUNTER — Other Ambulatory Visit: Payer: Self-pay

## 2020-01-25 DIAGNOSIS — F319 Bipolar disorder, unspecified: Secondary | ICD-10-CM

## 2020-01-25 DIAGNOSIS — Z5900 Homelessness unspecified: Secondary | ICD-10-CM

## 2020-01-25 MED ORDER — OLANZAPINE 10 MG PO TABS: 10.0000 mg | ORAL_TABLET | Freq: Every day | ORAL | Status: DC

## 2020-01-25 MED ORDER — DULOXETINE HCL 60 MG PO CPEP
60.0000 mg | ORAL_CAPSULE | Freq: Every day | ORAL | Status: DC
  Administered 2020-01-25: 08:00:00 60 mg via ORAL
  Filled 2020-01-25: qty 1

## 2020-01-25 MED ORDER — ACETAMINOPHEN 325 MG PO TABS: 650.0000 mg | ORAL_TABLET | Freq: Four times a day (QID) | ORAL | Status: DC | PRN

## 2020-01-25 MED ORDER — ALUM & MAG HYDROXIDE-SIMETH 200-200-20 MG/5ML PO SUSP: 30.0000 mL | ORAL | Status: DC | PRN

## 2020-01-25 MED ORDER — OLANZAPINE 5 MG PO TABS
5.0000 mg | ORAL_TABLET | Freq: Every day | ORAL | Status: DC
  Administered 2020-01-25: 08:00:00 5 mg via ORAL
  Filled 2020-01-25: qty 1

## 2020-01-25 MED ORDER — MAGNESIUM HYDROXIDE 400 MG/5ML PO SUSP: 30.0000 mL | Freq: Every day | ORAL | Status: DC | PRN

## 2020-01-25 NOTE — Plan of Care (Signed)
[facility] Crisis Note  Reason for Crisis Plan: Suicidal Ideation with plan to Overdose on Meds   Plan of Care: Crisis Stabilization  Family Support:    None  Current Living Environment:  Living Arrangements: Other (Comment)(homeless)  Insurance:   Hospital Account    Name Acct ID Class Status Primary Coverage   Kayla Silva, Kayla Silva [id number] BEHAVIORAL HEALTH OBSERVATION Open None        Guarantor Account (for Hospital Account [id number])    Name Relation to Pt Service Area Active? Acct Type   Kayla Silva Self [facility] Yes Behavioral Health   Address Phone       [address] [place], [place] [address] [phone](H)          Coverage Information (for Hospital Account [id number])    Not on file      Legal Guardian:     Primary Care Provider:  Lavinia Sharps, Kayla Silva  Current Outpatient Providers; None  Psychiatrist:   None  Counselor/Therapist:     Compliant with Medications: Yes  Additional Information: Patient is a 44 year old Caucasian female admitted on a voluntary basis from [facility] hospital for +suicidal ideations with a plan to overdose on all of her medications.  Pt lists her stressor as the death of her boyfriend in 2023-11-30 due to overdose on heroine. She states she also overdosed at that time also. Pt self reports a history of MDD and anxiety and polysubstance abuse (cocaine, marijuana and alcohol), but states that she is not currently using. Pt also reports being currently homeless, states friend she was living with asked her to move out. Pt educated on unit rules and protocols, verbalizes understanding, and verbally contracts for safety on the unit.  Q15 minute safety checks initiated.   Kayla Silva 2/1/20211:10 AM
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
## 2020-01-25 NOTE — Progress Notes (Signed)
[employer] NOVEL CORONAVIRUS (COVID-19) DAILY CHECK-OFF SYMPTOMS - answer yes or no to each - every day NO YES  Have you had a fever in the past 24 hours?  . Fever (Temp > 37.80C / 100F) X   Have you had any of these symptoms in the past 24 hours? . New Cough .  Sore Throat  .  Shortness of Breath .  Difficulty Breathing .  Unexplained Body Aches   X   Have you had any one of these symptoms in the past 24 hours not related to allergies?   . Runny Nose .  Nasal Congestion .  Sneezing   X   If you have had runny nose, nasal congestion, sneezing in the past 24 hours, has it worsened?  X   EXPOSURES - check yes or no X   Have you traveled outside the state in the past 14 days?  X   Have you been in contact with someone with a confirmed diagnosis of COVID-19 or PUI in the past 14 days without wearing appropriate PPE?  X   Have you been living in the same home as a person with confirmed diagnosis of COVID-19 or a PUI (household contact)?    X   Have you been diagnosed with COVID-19?    X              What to do next: Answered NO to all: Answered YES to anything:   Proceed with unit schedule Follow the [facility] Inpatient Flowsheet.   Clevie Prout K. Vola Beneke MSN, RN, [facility] [phone] 

## 2020-01-25 NOTE — Patient Outreach (Signed)
CPSS met with the patient at the [facility] Observation Unit in order to provide substance use recovery support and help with getting connected to substance use recovery resources. Patient reports a past history of cocaine and alcohol use. Currently, the patient reported that she has achieved over 60 days of continuous sobriety. Patient is passionate about her substance use recovery process and wants to get connected to long-term residential substance use treatment. Patient plans to follow up with the [facility] for their long-term residential substance use treatment program. CPSS provided the patient with a flier detailing the [facility] [employer] [facility] as well as talked to the patient about their residential substance use treatment services. CSW contacted the [facility], and they currently have beds available in their female shelter. CSW was able to help coordinate transportation services for the patient to get to the [facility]. CPSS still provided the patient with information for other substance use recovery resources including residential/outpatient treatment center list, online/in-person [facility] NA/AA meeting list, [facility] vacancy list/flier detailing [employer], and CPSS contact information. CPSS strongly encouraged the patient to follow up with CPSS if needed for further help with CPSS substance use recovery outreach services. Patient was pleasant, cooperative, and thanked CPSS for the CPSS related services provided.
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
## 2020-01-25 NOTE — Progress Notes (Signed)
CSW coordinated with ride share. They will call RN when they arrive. ETA to be determined.   Drucilla Schmidt, MSW, LCSW-A Clinical Disposition Social Worker [facility]/TTS [phone]

## 2020-01-25 NOTE — Progress Notes (Signed)
Discharge:  Pt is a caucasian female of 43 years. Pt is alert and oriented X 4. Pt given AVS packet to include medication information update, future appointment information, and other resource information. Pt given all belongings and signature obtained. Pt discharged to shelter. Transportation provided to shelter.    Einar Crow. Melvyn Neth MSN, RN, [facility] [phone]

## 2020-01-25 NOTE — Plan of Care (Signed)
  Problem: Education: Goal: Ability to make informed decisions regarding treatment will improve Outcome: Progressing   Problem: Coping: Goal: Coping ability will improve Outcome: Progressing   Problem: Medication: Goal: Compliance with prescribed medication regimen will improve Outcome: Progressing   Problem: Self-Concept: Goal: Ability to disclose and discuss suicidal ideas will improve Outcome: Progressing Goal: Will verbalize positive feelings about self Outcome: Progressing   Problem: Education: Goal: Utilization of techniques to improve thought processes will improve Outcome: Progressing Goal: Knowledge of the prescribed therapeutic regimen will improve Outcome: Progressing   Problem: Safety: Goal: Ability to disclose and discuss suicidal ideas will improve Outcome: Progressing Goal: Ability to identify and utilize support systems that promote safety will improve Outcome: Progressing   Problem: Self-Concept: Goal: Will verbalize positive feelings about self Outcome: Progressing Goal: Level of anxiety will decrease Outcome: Progressing

## 2020-01-25 NOTE — Progress Notes (Signed)
[place] NOVEL CORONAVIRUS (COVID-19) DAILY CHECK-OFF SYMPTOMS - answer yes or no to each - every day NO YES  Have you had a fever in the past 24 hours?  . Fever (Temp > 37.80C / 100F) X   Have you had any of these symptoms in the past 24 hours? . New Cough .  Sore Throat  .  Shortness of Breath .  Difficulty Breathing .  Unexplained Body Aches   X   Have you had any one of these symptoms in the past 24 hours not related to allergies?   . Runny Nose .  Nasal Congestion .  Sneezing   X   If you have had runny nose, nasal congestion, sneezing in the past 24 hours, has it worsened?  X   EXPOSURES - check yes or no X   Have you traveled outside the state in the past 14 days?  X   Have you been in contact with someone with a confirmed diagnosis of COVID-19 or PUI in the past 14 days without wearing appropriate PPE?  X   Have you been living in the same home as a person with confirmed diagnosis of COVID-19 or a PUI (household contact)?    X   Have you been diagnosed with COVID-19?    X              What to do next: Answered NO to all: Answered YES to anything:   Proceed with unit schedule Follow the [facility] Inpatient Flowsheet.   Tajee Savant K. Dailyn Kempner MSN, RN, [facility] [phone] 

## 2020-01-25 NOTE — BH Assessment (Signed)
[facility] Assessment Progress Note  Per Nelly Rout, MD, this pt does not require psychiatric hospitalization at this time.  Pt is to be discharged from the [facility] with recommendation to continue treatment with her current outpatient providers.  This has been included in pt's discharge instructions.  A peer support consult has also been ordered to attempt to find enhanced substance abuse treatment services for pt.  Pt's nurse has been notified.  Doylene Canning, MA Triage Specialist [phone]

## 2020-01-25 NOTE — Progress Notes (Signed)
CSW called [facility]. They do have beds available in their female shelter.   CSW will coordinate transportation services to [facility].   CSW staffed with NP, Shuvon Rankins.   Drucilla Schmidt, MSW, LCSW-A Clinical Disposition Social Worker [facility]/TTS [phone]

## 2020-01-25 NOTE — Discharge Instructions (Signed)
For your behavioral health needs, you are advised to continue treatment with your current outpatient providers:       Jacquenette Shone      [phone]       [facility]      [address] [place], [place] [address]      [phone]
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
## 2020-09-07 ENCOUNTER — Encounter (HOSPITAL_COMMUNITY): Payer: Self-pay

## 2020-09-07 ENCOUNTER — Other Ambulatory Visit: Payer: Self-pay

## 2020-09-07 ENCOUNTER — Emergency Department (HOSPITAL_COMMUNITY)
Admission: EM | Admit: 2020-09-07 | Discharge: 2020-09-07 | Disposition: A | Discharge status: Home / Self Care | Payer: Self-pay | Attending: Emergency Medicine | Admitting: Emergency Medicine

## 2020-09-07 ENCOUNTER — Emergency Department (HOSPITAL_COMMUNITY): Payer: Self-pay

## 2020-09-07 DIAGNOSIS — Z20822 Contact with and (suspected) exposure to covid-19: Secondary | ICD-10-CM | POA: Insufficient documentation

## 2020-09-07 DIAGNOSIS — J029 Acute pharyngitis, unspecified: Secondary | ICD-10-CM | POA: Insufficient documentation

## 2020-09-07 DIAGNOSIS — F17210 Nicotine dependence, cigarettes, uncomplicated: Secondary | ICD-10-CM | POA: Insufficient documentation

## 2020-09-07 DIAGNOSIS — R059 Cough, unspecified: Secondary | ICD-10-CM

## 2020-09-07 DIAGNOSIS — R079 Chest pain, unspecified: Secondary | ICD-10-CM | POA: Insufficient documentation

## 2020-09-07 DIAGNOSIS — M791 Myalgia, unspecified site: Secondary | ICD-10-CM | POA: Insufficient documentation

## 2020-09-07 DIAGNOSIS — J3489 Other specified disorders of nose and nasal sinuses: Secondary | ICD-10-CM | POA: Insufficient documentation

## 2020-09-07 DIAGNOSIS — R05 Cough: Secondary | ICD-10-CM | POA: Insufficient documentation

## 2020-09-07 DIAGNOSIS — R509 Fever, unspecified: Secondary | ICD-10-CM | POA: Insufficient documentation

## 2020-09-07 DIAGNOSIS — J988 Other specified respiratory disorders: Secondary | ICD-10-CM

## 2020-09-07 LAB — SARS CORONAVIRUS 2 BY RT PCR (HOSPITAL ORDER, PERFORMED IN ~~LOC~~ HOSPITAL LAB): SARS Coronavirus 2: NEGATIVE

## 2020-09-07 MED ORDER — BENZONATATE 100 MG PO CAPS: 100.0000 mg | ORAL_CAPSULE | Freq: Three times a day (TID) | ORAL | 0 refills | Status: DC | PRN

## 2020-09-07 MED ORDER — IBUPROFEN 400 MG PO TABS
400.0000 mg | ORAL_TABLET | Freq: Once | ORAL | Status: AC
  Administered 2020-09-07: 400 mg via ORAL
  Filled 2020-09-07: qty 1

## 2020-09-07 MED ORDER — AEROCHAMBER PLUS FLO-VU SMALL MISC
1.0000 | Freq: Once | Status: AC
  Administered 2020-09-07: 1
  Filled 2020-09-07 (×2): qty 1

## 2020-09-07 MED ORDER — ALBUTEROL SULFATE HFA 108 (90 BASE) MCG/ACT IN AERS
2.0000 | INHALATION_SPRAY | Freq: Once | RESPIRATORY_TRACT | Status: AC
  Administered 2020-09-07: 2 via RESPIRATORY_TRACT
  Filled 2020-09-07: qty 6.7

## 2020-09-07 MED ORDER — METHYLPREDNISOLONE 4 MG PO TBPK: ORAL_TABLET | ORAL | 0 refills | Status: DC

## 2020-09-07 NOTE — ED Provider Notes (Signed)
[facility] EMERGENCY DEPARTMENT Provider Note   CSN: [id number] Arrival date & time: 09/07/20  1352     History Chief Complaint  Patient presents with  . Cough    Kayla Silva is a 44 y.o. female  The history is provided by the patient. No language interpreter was used.  Cough Cough characteristics:  Productive and barking Sputum characteristics:  Green Severity:  Severe Onset quality:  Gradual Duration:  1 day Timing:  Constant Progression:  Worsening Chronicity:  New Smoker: yes   Context: upper respiratory infection   Context: not animal exposure, not exposure to allergens, not fumes, not occupational exposure, not sick contacts, not smoke exposure, not weather changes and not with activity   Relieved by:  Nothing Worsened by:  Deep breathing and activity Ineffective treatments:  None tried Associated symptoms: chest pain, chills, fever, myalgias, sinus congestion and sore throat   Associated symptoms: no diaphoresis, no ear fullness, no ear pain, no eye discharge, no headaches, no rash, no rhinorrhea, no shortness of breath, no weight loss and no wheezing        Past Medical History:  Diagnosis Date  . Bipolar 1 disorder (HCC)   . Depression     Patient Active Problem List   Diagnosis Date Noted  . Bipolar 1 disorder, depressed (HCC) 01/25/2020  . Homelessness 01/25/2020  . PTSD (post-traumatic stress disorder)   . MDD (major depressive disorder), recurrent episode, severe (HCC) 01/12/2020  . Severe recurrent major depression without psychotic features (HCC) 09/15/2019  . Alcohol dependence (HCC) 06/28/2014  . Bipolar II disorder (HCC) 06/21/2014    Past Surgical History:  Procedure Laterality Date  . TUBAL LIGATION       OB History   No obstetric history on file.     History reviewed. No pertinent family history.  Social History   Tobacco Use  . Smoking status: Current Every Day Smoker    Packs/day: 1.00    Types: Cigarettes  .  Smokeless tobacco: Never Used  Substance Use Topics  . Alcohol use: Not Currently  . Drug use: Not Currently    Types: Marijuana, Cocaine    Home Medications Prior to Admission medications   Medication Sig Start Date End Date Taking? Authorizing Provider  DULoxetine (CYMBALTA) 60 MG capsule Take 60 mg by mouth daily. 11/22/19   [provider]  hydrOXYzine (ATARAX/VISTARIL) 25 MG tablet Take 1 tablet (25 mg total) by mouth 3 (three) times daily as needed for anxiety. 01/16/20   Aldean Baker, NP  nicotine (NICODERM CQ - DOSED IN MG/24 HOURS) 21 mg/24hr patch Place 1 patch (21 mg total) onto the skin daily. 01/17/20   Aldean Baker, NP  OLANZapine (ZYPREXA) 10 MG tablet Take 10 mg by mouth at bedtime. 11/21/19   [provider]  OLANZapine (ZYPREXA) 5 MG tablet Take 5 mg by mouth daily. 11/21/19   [provider]  prazosin (MINIPRESS) 2 MG capsule Take 1 capsule (2 mg total) by mouth at bedtime. 01/16/20   Aldean Baker, NP  sertraline (ZOLOFT) 50 MG tablet Take 1 tablet (50 mg total) by mouth daily. 01/17/20   Aldean Baker, NP  traZODone (DESYREL) 100 MG tablet Take 1 tablet (100 mg total) by mouth at bedtime as needed for sleep. 01/16/20   Aldean Baker, NP  rOPINIRole (REQUIP) 0.5 MG tablet Take 1 tablet (0.5 mg total) by mouth 3 (three) times daily. Patient not taking: Reported on 09/15/2019 06/28/14 10/08/19  [place],  Everardo All, FNP    Allergies    Haldol [haloperidol lactate], Lithium, Valproic acid, and Wellbutrin [bupropion]  Review of Systems   Review of Systems  Constitutional: Positive for chills and fever. Negative for diaphoresis and weight loss.  HENT: Positive for sore throat. Negative for ear pain and rhinorrhea.   Eyes: Negative for discharge.  Respiratory: Positive for cough. Negative for shortness of breath and wheezing.   Cardiovascular: Positive for chest pain.  Musculoskeletal: Positive for myalgias.  Skin: Negative for rash.  Neurological:  Negative for headaches.    Physical Exam Updated Vital Signs BP (!) 152/77 (BP Location: Right Arm)   Pulse (!) 116   Temp 100.1 F (37.8 C) (Oral)   Resp 20   Ht 5' (1.524 m)   Wt 81.6 kg   SpO2 100%   BMI 35.15 kg/m   Physical Exam Vitals and nursing note reviewed.  Constitutional:      General: She is not in acute distress.    Appearance: She is well-developed. She is not diaphoretic.  HENT:     Head: Normocephalic and atraumatic.  Eyes:     General: No scleral icterus.    Conjunctiva/sclera: Conjunctivae normal.  Cardiovascular:     Rate and Rhythm: Normal rate and regular rhythm.     Heart sounds: Normal heart sounds. No murmur heard.  No friction rub. No gallop.   Pulmonary:     Effort: Pulmonary effort is normal. No respiratory distress.     Breath sounds: Rhonchi present.  Abdominal:     General: Bowel sounds are normal. There is no distension.     Palpations: Abdomen is soft. There is no mass.     Tenderness: There is no abdominal tenderness. There is no guarding.  Musculoskeletal:     Cervical back: Normal range of motion.  Skin:    General: Skin is warm and dry.  Neurological:     Mental Status: She is alert and oriented to person, place, and time.  Psychiatric:        Behavior: Behavior normal.     ED Results / Procedures / Treatments   Labs (all labs ordered are listed, but only abnormal results are displayed) Labs Reviewed  SARS CORONAVIRUS 2 BY RT PCR (HOSPITAL ORDER, PERFORMED IN [facility])    EKG None  Radiology No results found.  Procedures Procedures (including critical care time)  Medications Ordered in ED Medications  albuterol (VENTOLIN HFA) 108 (90 Base) MCG/ACT inhaler 2 puff (has no administration in time range)  AeroChamber Plus Flo-Vu Small device MISC 1 each (has no administration in time range)  ibuprofen (ADVIL) tablet 400 mg (has no administration in time range)    ED Course  I have reviewed the  triage vital signs and the nursing notes.  Pertinent labs & imaging results that were available during my care of the patient were reviewed by me and considered in my medical decision making (see chart for details).  Clinical Course as of Sep 07 2016  Wed Sep 07, 2020  2017 [facility] Chest [place] 1 View [AH]    Clinical Course User Index [AH] Arthor Captain, [place]   MDM Rules/Calculators/A&P                          Pt CXR negative for acute infiltrate. Patients symptoms are consistent with URI, likely viral etiology. Discussed that antibiotics are not indicated for viral infections. Pt will be discharged  with symptomatic treatment.  Verbalizes understanding and is agreeable with plan. Pt is hemodynamically stable & in NAD prior to dc.  Final Clinical Impression(s) / ED Diagnoses Final diagnoses:  None    Rx / DC Orders ED Discharge Orders    None       Arthor Captain, PA-C 09/07/20 2019    Maia Plan, MD 09/12/20 1625

## 2020-09-07 NOTE — Discharge Instructions (Addendum)
Get help right away if you: Cough up blood. Feel pain in your chest. Have severe shortness of breath. Faint or keep feeling like you are going to faint. Have a severe headache. Have fever or chills that get worse. 

## 2020-09-07 NOTE — ED Triage Notes (Signed)
Pt to er, pt states that she is here for a cough that started last night, states that she got her vaccine, denies exposure.

## 2020-12-26 LAB — CBC WITH AUTO DIFFERENTIAL
Absolute Baso #: 0 10*3/uL (ref 0.0–0.2)
Absolute Eos #: 0.3 10*3/uL (ref 0.0–0.5)
Absolute Lymph #: 3.1 10*3/uL (ref 1.0–3.2)
Absolute Mono #: 0.7 10*3/uL (ref 0.3–1.0)
Basophils %: 0.1 % (ref 0.0–2.0)
Eosinophils %: 2.4 % (ref 0.0–7.0)
Hematocrit: 47.3 % — ABNORMAL HIGH (ref 34.0–47.0)
Hemoglobin: 16 g/dL — ABNORMAL HIGH (ref 11.5–15.7)
Immature Grans (Abs): 0.05 10*3/uL (ref 0.00–0.06)
Immature Granulocytes: 0.5 % (ref 0.1–0.6)
Lymphocytes: 28.1 % (ref 15.0–45.0)
MCH: 30.1 pg (ref 27.0–34.5)
MCHC: 33.8 g/dL (ref 32.0–36.0)
MCV: 88.9 fL (ref 81.0–99.0)
MPV: 10.2 fL (ref 7.2–13.2)
Monocytes: 6.5 % (ref 4.0–12.0)
NRBC Absolute: 0 10*3/uL (ref 0.000–0.012)
NRBC Automated: 0 % (ref 0.0–0.2)
Neutrophils %: 62.4 % (ref 42.0–74.0)
Neutrophils Absolute: 6.9 10*3/uL (ref 1.6–7.3)
Platelets: 276 10*3/uL (ref 140–440)
RBC: 5.32 x10e6/mcL — ABNORMAL HIGH (ref 3.60–5.20)
RDW: 14.3 % (ref 11.0–16.0)
WBC: 11.1 10*3/uL — ABNORMAL HIGH (ref 3.8–10.6)

## 2020-12-26 LAB — ETHANOL: Ethanol Lvl: 26.4 mg/dL — ABNORMAL HIGH (ref 0.0–10.0)

## 2020-12-26 LAB — BASIC METABOLIC PANEL
Anion Gap: 15 mmol/L (ref 2–17)
BUN: 4 mg/dL — ABNORMAL LOW (ref 6–20)
CO2: 18 mmol/L — ABNORMAL LOW (ref 22–29)
Calcium: 8.7 mg/dL (ref 8.6–10.0)
Chloride: 104 mmol/L (ref 98–107)
Creatinine: 0.6 mg/dL (ref 0.5–1.0)
GFR African American: 130 mL/min/{1.73_m2} (ref >=90)
GFR Non-African American: 112 mL/min/{1.73_m2} (ref >=90)
Glucose: 86 mg/dL (ref 70–99)
OSMOLALITY CALCULATED: 270 mOsm/kg (ref 270–287)
Potassium: 4.1 mmol/L (ref 3.5–5.3)
Sodium: 137 mmol/L (ref 135–145)

## 2020-12-26 LAB — URINE DRUG SCREEN
Amphetamine Screen, Urine: NEGATIVE
Barbiturate Screen, Ur: NEGATIVE
Benzodiazepine Screen, Urine: POSITIVE — AB
Cannabinoid Scrn, Ur: POSITIVE — AB
Cocaine Screen Urine: NEGATIVE
Opiate Screen, Urine: NEGATIVE

## 2020-12-26 LAB — COVID-19 & INFLUENZA COMBO
INFLUENZA A: NOT DETECTED
INFLUENZA B: NOT DETECTED
SARS-CoV-2: NOT DETECTED

## 2020-12-26 LAB — TSH WITH REFLEX TO FT4: TSH: 2.23 mcIU/mL (ref 0.358–3.740)

## 2020-12-26 NOTE — ED Notes (Signed)
ED Pre-Arrival Note        Pre-Arrival Summary    Name:  62,    Current Date:  12/26/2020 01:39:07 EST  Gender:  Female  Date of Birth:    Age:  45  Pre-Arrival Type:  EMS  ETA:  12/26/2020 01:44:00 EST  Primary Care Physician:    Presenting Problem:    Pre-Arrival User:  Jeanella Flattery RN, Janetta Hora  Referring Source:    Location:  [facility]            PreArrival Communication Form  Emergency Department        Additional Patient Information:        Orders:  [    ] CBC                                            [     ] CT Head no contrast  [    ] BMP                                           [     ] CT Abdomen/Pelvis no contrast  [    ] PT/INR                                       [     ] CT Abdomen/Pelvis IV contrast, w/ oral contrast  [    ] Troponin                                   [     ] CT Abdomen/Pelvis IV contrast, no oral contrast  [    ] BNP                                            [     ] See ordersheet  [    ] CXR                                             [     ] Other:__________________________  [    ] EKG
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
## 2020-12-26 NOTE — ED Notes (Signed)
 ED Patient Education Note     Patient Education Materials Follows:  Ambulatory            Depression and Suicide in Older Adults           Nearly 2 million older Americans have some type of depression. Some of them even take their own lives. Yet depression among older adults is often ignored.Learn the warning signs. You may help spare a loved one needless pain. You may also save a life.    What is depression?   Depression is a common and serious illness that affects the way you think and feel. It is not a normal part of aging, nor is it a sign of weakness, a character flaw, or something you can snap out of. Most people with depression need treatment to get better. The most common symptom is a feeling of deep sadness. People who are depressed also may seem tired and listless. And nothing seems to give them pleasure. It?s normal to grieve or be sad sometimes. But sadness lessens or passes with time. Depression rarely goes away or improves on its own. A person with clinical depression can't snap out of it. Other symptoms of depression are:      Sleeping more or less than normal      Eating more or less than normal      Having headaches, stomachaches, or other pains that don?t go away      Feeling nervous,?empty,?or worthless      Crying a great deal     Thinking or talking about suicide or death      Loss of interest in activities previously enjoyed     Social isolation     Feeling confused or forgetful     What causes it?   The causes of depression aren?t fully known. But it is thought to result from a complex blend of these factors:      Biochemistry. Certain chemicals in the brain play a role.      Genes. Depression does run in families.      Life stress. Life stresses can also trigger depression in some people. Older adults often face many stressors, such as death of friends or a spouse, health problems, and financial concerns.      Chronic conditions. This includes conditions such as diabetes,  heart disease, or cancer. These can cause symptoms of depression. Medicine side effects can cause changes in thoughts and behaviors.     How you can help   Often, depressed people may not want to ask for help. When they do, they may be ignored. Or, they may receive the wrong treatment. You can help by showing parents and older friends love and support. If they seem depressed, don?t lecture the person, ignore the symptoms, or discount the symptoms as a ?normal? part of aging ?which they are not. Get involved, listen, and show interest and support.    Help them understand that depression is a treatable illness. Tell them you can help them find the right treatment. Offer to go to their healthcare provider's appointment with them for support when the symptoms are discussed. With their approval, contact a local mental health center, social service agency, or hospital about services.    You can be an advocate for him or her at healthcare appointments. Many older adults have chronic illnesses that can cause symptoms of depression. Medicine side effects can change thoughts and behaviors. You can help make sure that the healthcare provider  looks at all of these factors. He or she should refer your family member or friend to a mental healthcare provider when needed. in some cases, untreated depression can lead to a misdiagnosis. A person may be diagnosed with a brain disorder such as dementia. If the healthcare provider does not take the issue of depression seriously, help your family member or friend to find another provider.    Don't be afraid to ask   If you think an older person you care about could be suicidal, ask, ?Have you thought about suicide?? Most people will tell you the truth. If they say ?yes,? they may already have a plan for how and when they will attempt it. Find out as much as you can. The more detailed the plan, and the easier it is to carry out, the more danger the person is in right now. Tell the person  you are there for them and do not want them to harm him or herself. Don't wait to get help for the person. Call the person's healthcare provider, local hospital, or emergency services.    To learn more    National Suicide Prevention Lifeline (crisis hotline)    800-273-TALK 360 672 6166)      [employer]   (906)121-2814   http://www.maynard.net/     National Alliance on Mental Illness   309-623-8526   www.nami.vickey     [facility] [place]   806 647 6694   www.nmha.org     National Suicide Hotline   800-SUICIDE 805-718-4408)     Call 911   Never leave the person alone. A person who is actively suicidal needs psychiatric care right away. They will need constant supervision. Never leave the person out of sight. Call 911 or the national 24-hour suicide crisis hotline at 800-273-TALK 303 636 1500). You can also take the person to the closest emergency room.        2000-2021 [employer], [place]. All rights reserved. This information is not intended as a substitute for professional medical care. Always follow your healthcare professional's instructions.               Recognizing Suicide Warning Signs in Yourself            People who are thinking about suicide may not know they are depressed. Certain thoughts, feelings, and actions can be signals that let you know you may need help. The best thing you can do is watch for signs that you may be at risk. Then, ask for help. You can talk with your regular healthcare provider or get help from a mental health provider.    Depression   Depression is a treatable illness, just like diabetes or heart disease. And like those illnesses, depression is not something that you can just snap out of. To feel better, treatment is needed before depression gets to a point that it can endanger your life. To know if depression is causing you to feel like ending your life, ask yourself:      Do I feel worthless, guilty, helpless, or hopeless?      Have I been  feeling sad, down, or blue on most days?      Have I lost interest in my work or people I used to enjoy?      Do I have trouble sleeping or do I sleep too much?      Do I eat more or less than normal?      Do I feel tired, weak,  and low on energy?      Do I feel restless and unable to sit still?      Do I have trouble thinking or making choices?      Do I cry more than normal?      Do I feel life isn't worth living?     Warning signs for suicide   Call your healthcare provider or get help right away if you have any of the warning signs below. You can also call a mental health clinic or a 24-hour suicide crisis hotline for help and support. Warning signs for suicide include:      Thinking often about taking your life      Planning how you may attempt it      Talking or writing about committing suicide      Feeling that death is the only solution to your problems      Feeling a pressing need to make out your will or arrange your funeral      Giving away things you own      Taking part in risky behaviors, such as having sex with someone you don't know, or drinking and driving      Buying a lethal weapon, such as a gun, or hoarding medicines that could be used in an overdose     Call 911   If you are in immediate risk of harming yourself, call 911   To learn more   For more information about depression and suicide prevention:      [employer] [facility] [employer]    817-751-8427   http://www.maynard.net/     National Suicide Prevention Lifeline    860 381 0584 (800-273-TALK)    www.suicidepreventionlifeline.[employer] [facility] [employer]ness    330-291-9088   www.nami.vickey     [facility] [place]   (475)145-8291   www.nmha.vickey [place] Suicide Hotline   304-521-4609 (800-SUICIDE)         706-453-4514 [employer], [place]. All rights reserved. This information is not intended as a substitute for professional medical care. Always follow your healthcare professional's instructions.
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
## 2020-12-26 NOTE — ED Notes (Signed)
ED Triage Note       ED Secondary Triage Entered On:  12/26/2020 1:49 EST    Performed On:  12/26/2020 1:48 EST by Hardie Lora, RN, Zeriah               General Information   Barriers to Learning :   None evident   Languages :   English   Pt. Currently Receiving Radiation :   No   COVID-19 Vaccine Status :   2 Doses received   ED Home Meds Section :   Document assessment   [facility] ED Fall Risk Section :   Document assessment   ED Advance Directives Section :   Document assessment   ED Palliative Screen :   N/A (prefilled for <65yo)   Hardie Lora RN, Kristeen Miss - 12/26/2020 1:48 EST   (As Of: 12/26/2020 01:49:38 EST)   Problems(Active)    Anxiety (SNOMED CT  :27035009 )  Name of Problem:   Anxiety ; Recorder:   Hardie Lora, RN, Kristeen Miss; Confirmation:   Confirmed ; Classification:   Patient Stated ; Code:   [id number] ; Contributor System:   PowerChart ; Last Updated:   12/26/2020 1:46 EST ; Life Cycle Date:   12/26/2020 ; Life Cycle Status:   Active ; Vocabulary:   SNOMED CT        Bipolar affective disorder (SNOMED CT  :71696789 )  Name of Problem:   Bipolar affective disorder ; Recorder:   Hardie Lora, RN, Kristeen Miss; Confirmation:   Confirmed ; Classification:   Patient Stated ; Code:   [id number] ; Contributor System:   PowerChart ; Last Updated:   12/26/2020 1:45 EST ; Life Cycle Date:   12/26/2020 ; Life Cycle Status:   Active ; Vocabulary:   SNOMED CT        Depression (SNOMED CT  :02585277 )  Name of Problem:   Depression ; Recorder:   Hardie Lora, RN, Kristeen Miss; Confirmation:   Confirmed ; Classification:   Patient Stated ; Code:   [id number] ; Contributor System:   Dietitian ; Last Updated:   12/26/2020 1:45 EST ; Life Cycle Date:   12/26/2020 ; Life Cycle Status:   Active ; Vocabulary:   SNOMED CT          Diagnoses(Active)    Suicidal ideation  Date:   12/26/2020 ; Diagnosis Type:   Reason For Visit ; Confirmation:   Complaint of ; Clinical Dx:   Suicidal ideation ; Classification:   Medical ; Clinical Service:   Non-Specified ; Code:   PNED ; Probability:   0  ; Diagnosis Code:   [id number]             -    Procedure History   (As Of: 12/26/2020 01:49:39 EST)     [facility] Perch Fall Risk Assessment Tool   Hx of falling last 3 months ED Fall :   No   Patient confused or disoriented ED Fall :   No   Patient intoxicated or sedated ED Fall :   No   Patient impaired gait ED Fall :   No   Use a mobility assistance device ED Fall :   No   Patient altered elimination ED Fall :   No   [facility] ED Fall Score :   0    Hardie Lora RN, Kristeen Miss - 12/26/2020 1:48 EST   ED Advance Directive   Advance Directive :   No   Hardie Lora RN, Zeriah - 12/26/2020 1:48 EST   Med Hx  Medication List   (As Of: 12/26/2020 01:49:39 EST)
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
## 2020-12-26 NOTE — Case Communication (Signed)
 CM Discharge Planning Assessment - Text       CM Progress Note Entered On:  01/15/2021 9:45 EST    Performed On:  01/15/21 9:28 EST by MACARON,  KATHY               CM Progress Note   CM Home/Lay Caregiver Name/Relationship :   Rosie/Mom/ [phone]  Saddie Kugel   CM Progress Note :   16-Jan-2020 KJM/MSW consultation requested by MD for disposition of care needs.   Patient assessed by Dr. Jesus and denies any suicidal ideations at present and able to contract for safety.  MSW spoke with the patient, who gave permission to speak with her Mother, Darryll.  MSW placed a call to the patient's Mother, Darryll, who resides on [place] with her companion, [name].  Per the Mother's report, she loves her daughter but they just don't get along.  The mother noted originally the patient came for the holidays and then was supposed to go to [place] to stay with her Father.  Apparently a few days ago, the Father changed his mind about the patient coming to stay with him.  The Mother, the patient and her sister, Saddie apparently had a long conversation last night and the Mother was under the impression that the patient was going to stay with her.  The Mother denied any concerns about her safety or that of the patient's but did not the patient pushed her after the Mother told her some things that she didn't like.  The Mother reports that her companion pays the rent and said it was the patient or the Mother but one of them had to go.  The Mother provided the correct number for the sister and MSW to follow up with her.  MSW phone call to the patient's sister, Saddie, who resides in [place].  Apparently, she was on the phone with her Mother and her sister for a long conversation last night.  The sister wasn't aware of a definitive plan for her sister to come to [facility] and wasn't sure that the patient would like this plan.  MSW reviewed the options for discharge and availability of services for the homeless in this area.  MSW  asked if the sister would be willing to discuss the option of the patient staying with the Mother until arrangements could be made for her transport to [place]; the sister was agreeable.  MSW received a call back from [name], the sister, and she reported that the Mother is amenable to the patient returning for the night if needed, until they make the arrangements.  MSW to review plan with MD and if amenable, will coordinate transport to the Mother's home at [address] [place].  Further disposition pending MD POC.     MACARON,  KATHY - Jan 15, 2021 9:28 EST   Z Codes   [phone] Problems dx to support group family issues :   Z63.79 - Other stressful life events affecting family and household   Z59 Problems dx to housing and economic issues :   Z59.01 - Sheltered homelessness   Z65 Problems related to other psychosocial issues :   Z65.9 - Problem related to unspecified psychosocial circumstances   MACARON,  KATHY - 12/26/2020 9:28 EST

## 2020-12-26 NOTE — Case Communication (Signed)
 CM Discharge Planning Assessment - Text       CM Progress Note Entered On:  12/26/2020 10:23 EST    Performed On:  12/26/2020 10:21 EST by MACARON,  KATHY               CM Progress Note   CM Home/Lay Caregiver Name/Relationship :   Margaret Gibbs/Mom/ [phone]  Margaret Gibbs   CM Progress Note :   12/27/2019 KJM/MSW consultation requested by MD for disposition of care needs.   Patient assessed by Dr. Jesus and denies any suicidal ideations at present and able to contract for safety.  MSW spoke with the patient, who gave permission to speak with her Mother, Margaret Gibbs.  MSW placed a call to the patient's Mother, Margaret Gibbs, who resides on [place] with her companion, Margaret Gibbs.  Per the Mother's report, she loves her daughter but they just don't get along.  The mother noted originally the patient came for the holidays and then was supposed to go to [place] to stay with her Father.  Apparently a few days ago, the Father changed his mind about the patient coming to stay with him.  The Mother, the patient and her sister, Margaret apparently had a long conversation last night and the Mother was under the impression that the patient was going to stay with her.  The Mother denied any concerns about her safety or that of the patient's but did not the patient pushed her after the Mother told her some things that she didn't like.  The Mother reports that her companion pays the rent and said it was the patient or the Mother but one of them had to go.  The Mother provided the correct number for the sister and MSW to follow up with her.  MSW phone call to the patient's sister, Margaret, who resides in [place].  Apparently, she was on the phone with her Mother and her sister for a long conversation last night.  The sister wasn't aware of a definitive plan for her sister to come to [facility] and wasn't sure that the patient would like this plan.  MSW reviewed the options for discharge and availability of services for the homeless in this area.  MSW  asked if the sister would be willing to discuss the option of the patient staying with the Mother until arrangements could be made for her transport to [place]; the sister was agreeable.  MSW received a call back from [name], the sister, and she reported that the Mother is amenable to the patient returning for the night if needed, until they make the arrangements.  MSW to review plan with MD and if amenable, will coordinate transport to the Mother's home at [address] [place].  Further disposition pending MD POC.    1020 update Dr. Jesus and Dr. Lidia are amenable to discharge plan of care.  MSW reviewed plan of care with patient.  Charge RN to  coordinate discharge plan of care with transport to  Mother's home.         MACARON,  KATHY - 12/26/2020 10:21 EST

## 2023-05-03 ENCOUNTER — Ambulatory Visit (HOSPITAL_COMMUNITY)
Admission: EM | Admit: 2023-05-03 | Discharge: 2023-05-04 | Disposition: A | Discharge status: Home / Self Care | Payer: Medicaid Other | Attending: Family | Admitting: Family

## 2023-05-03 DIAGNOSIS — F3130 Bipolar disorder, current episode depressed, mild or moderate severity, unspecified: Secondary | ICD-10-CM | POA: Insufficient documentation

## 2023-05-03 DIAGNOSIS — R45851 Suicidal ideations: Secondary | ICD-10-CM | POA: Diagnosis not present

## 2023-05-03 DIAGNOSIS — Z1152 Encounter for screening for COVID-19: Secondary | ICD-10-CM | POA: Insufficient documentation

## 2023-05-03 DIAGNOSIS — Z79899 Other long term (current) drug therapy: Secondary | ICD-10-CM | POA: Insufficient documentation

## 2023-05-03 DIAGNOSIS — F319 Bipolar disorder, unspecified: Secondary | ICD-10-CM

## 2023-05-03 DIAGNOSIS — G8929 Other chronic pain: Secondary | ICD-10-CM | POA: Insufficient documentation

## 2023-05-03 DIAGNOSIS — Z5900 Homelessness unspecified: Secondary | ICD-10-CM | POA: Diagnosis not present

## 2023-05-03 DIAGNOSIS — M545 Low back pain, unspecified: Secondary | ICD-10-CM | POA: Insufficient documentation

## 2023-05-03 LAB — POCT URINE DRUG SCREEN - MANUAL ENTRY (I-SCREEN)
POC Amphetamine UR: NOT DETECTED
POC Buprenorphine (BUP): NOT DETECTED
POC Cocaine UR: NOT DETECTED
POC Marijuana UR: NOT DETECTED
POC Methadone UR: NOT DETECTED
POC Methamphetamine UR: NOT DETECTED
POC Morphine: NOT DETECTED
POC Oxazepam (BZO): POSITIVE — AB
POC Oxycodone UR: NOT DETECTED
POC Secobarbital (BAR): NOT DETECTED

## 2023-05-03 LAB — COMPREHENSIVE METABOLIC PANEL
ALT: 20 U/L (ref 0–44)
AST: 26 U/L (ref 15–41)
Albumin: 4 g/dL (ref 3.5–5.0)
Alkaline Phosphatase: 71 U/L (ref 38–126)
Anion gap: 10 (ref 5–15)
BUN: <5 mg/dL — ABNORMAL LOW (ref 6–20)
CO2: 25 mmol/L (ref 22–32)
Calcium: 9.1 mg/dL (ref 8.9–10.3)
Chloride: 106 mmol/L (ref 98–111)
Creatinine, Ser: 0.94 mg/dL (ref 0.44–1.00)
GFR, Estimated: >60 mL/min (ref >60)
Glucose, Bld: 96 mg/dL (ref 70–99)
Potassium: 3.9 mmol/L (ref 3.5–5.1)
Sodium: 141 mmol/L (ref 135–145)
Total Bilirubin: 0.7 mg/dL (ref 0.3–1.2)
Total Protein: 6.8 g/dL (ref 6.5–8.1)

## 2023-05-03 LAB — CBC WITH DIFFERENTIAL/PLATELET
Abs Immature Granulocytes: 0.04 10*3/uL (ref 0.00–0.07)
Basophils Absolute: 0 10*3/uL (ref 0.0–0.1)
Basophils Relative: 0 %
Eosinophils Absolute: 0.2 10*3/uL (ref 0.0–0.5)
Eosinophils Relative: 2 %
HCT: 44.6 % (ref 36.0–46.0)
Hemoglobin: 14.4 g/dL (ref 12.0–15.0)
Immature Granulocytes: 0 %
Lymphocytes Relative: 36 %
Lymphs Abs: 4 10*3/uL (ref 0.7–4.0)
MCH: 29.7 pg (ref 26.0–34.0)
MCHC: 32.3 g/dL (ref 30.0–36.0)
MCV: 92 fL (ref 80.0–100.0)
Monocytes Absolute: 0.4 10*3/uL (ref 0.1–1.0)
Monocytes Relative: 4 %
Neutro Abs: 6.5 10*3/uL (ref 1.7–7.7)
Neutrophils Relative %: 58 %
Platelets: 365 10*3/uL (ref 150–400)
RBC: 4.85 MIL/uL (ref 3.87–5.11)
RDW: 13 % (ref 11.5–15.5)
WBC: 11.2 10*3/uL — ABNORMAL HIGH (ref 4.0–10.5)
nRBC: 0 % (ref 0.0–0.2)

## 2023-05-03 LAB — TSH: TSH: 0.683 u[IU]/mL (ref 0.350–4.500)

## 2023-05-03 LAB — ETHANOL: Alcohol, Ethyl (B): <10 mg/dL (ref <10)

## 2023-05-03 LAB — POC URINE PREG, ED: Preg Test, Ur: NEGATIVE

## 2023-05-03 LAB — MAGNESIUM: Magnesium: 2.4 mg/dL (ref 1.7–2.4)

## 2023-05-03 MED ORDER — METOPROLOL TARTRATE 25 MG PO TABS
25.0000 mg | ORAL_TABLET | Freq: Every day | ORAL | Status: DC
  Administered 2023-05-04: 25 mg via ORAL
  Filled 2023-05-03: qty 1

## 2023-05-03 MED ORDER — GABAPENTIN 400 MG PO CAPS
800.0000 mg | ORAL_CAPSULE | Freq: Three times a day (TID) | ORAL | Status: DC
  Administered 2023-05-03 – 2023-05-04 (×2): 800 mg via ORAL
  Filled 2023-05-03 (×2): qty 2

## 2023-05-03 MED ORDER — ALUM & MAG HYDROXIDE-SIMETH 200-200-20 MG/5ML PO SUSP: 30.0000 mL | ORAL | Status: DC | PRN

## 2023-05-03 MED ORDER — PREGABALIN 75 MG PO CAPS: 300.0000 mg | ORAL_CAPSULE | Freq: Two times a day (BID) | ORAL | Status: DC

## 2023-05-03 MED ORDER — PRAZOSIN HCL 2 MG PO CAPS
2.0000 mg | ORAL_CAPSULE | Freq: Every day | ORAL | Status: DC
  Administered 2023-05-03: 2 mg via ORAL
  Filled 2023-05-03: qty 1

## 2023-05-03 MED ORDER — DULOXETINE HCL 60 MG PO CPEP: 60.0000 mg | ORAL_CAPSULE | Freq: Every day | ORAL | Status: DC

## 2023-05-03 MED ORDER — ATORVASTATIN CALCIUM 10 MG PO TABS
10.0000 mg | ORAL_TABLET | Freq: Every day | ORAL | Status: DC
  Administered 2023-05-04: 10 mg via ORAL
  Filled 2023-05-03: qty 1

## 2023-05-03 MED ORDER — NAPROXEN 500 MG PO TABS: 500.0000 mg | ORAL_TABLET | Freq: Once | ORAL | Status: DC | PRN

## 2023-05-03 MED ORDER — CYCLOBENZAPRINE HCL 10 MG PO TABS
10.0000 mg | ORAL_TABLET | Freq: Once | ORAL | Status: AC | PRN
  Administered 2023-05-03: 10 mg via ORAL
  Filled 2023-05-03: qty 1

## 2023-05-03 MED ORDER — QUETIAPINE FUMARATE 100 MG PO TABS
100.0000 mg | ORAL_TABLET | Freq: Every day | ORAL | Status: DC
  Administered 2023-05-03: 100 mg via ORAL
  Filled 2023-05-03: qty 1

## 2023-05-03 MED ORDER — CLONAZEPAM 0.5 MG PO TABS
0.5000 mg | ORAL_TABLET | Freq: Three times a day (TID) | ORAL | Status: DC | PRN
  Administered 2023-05-03 – 2023-05-04 (×2): 0.5 mg via ORAL
  Filled 2023-05-03 (×2): qty 1

## 2023-05-03 MED ORDER — GABAPENTIN 400 MG PO CAPS: 800.0000 mg | ORAL_CAPSULE | Freq: Three times a day (TID) | ORAL | Status: DC

## 2023-05-03 MED ORDER — NICOTINE 14 MG/24HR TD PT24
14.0000 mg | MEDICATED_PATCH | TRANSDERMAL | Status: DC
  Administered 2023-05-04: 14 mg via TRANSDERMAL
  Filled 2023-05-03: qty 1

## 2023-05-03 MED ORDER — TOPIRAMATE 25 MG PO TABS
25.0000 mg | ORAL_TABLET | Freq: Two times a day (BID) | ORAL | Status: DC
  Administered 2023-05-04: 25 mg via ORAL
  Filled 2023-05-03: qty 1

## 2023-05-03 MED ORDER — MAGNESIUM HYDROXIDE 400 MG/5ML PO SUSP: 30.0000 mL | Freq: Every day | ORAL | Status: DC | PRN

## 2023-05-03 MED ORDER — DULOXETINE HCL 30 MG PO CPEP
30.0000 mg | ORAL_CAPSULE | Freq: Every day | ORAL | Status: DC
  Administered 2023-05-04: 30 mg via ORAL
  Filled 2023-05-03: qty 1

## 2023-05-03 MED ORDER — ACETAMINOPHEN 325 MG PO TABS: 650.0000 mg | ORAL_TABLET | Freq: Four times a day (QID) | ORAL | Status: DC | PRN

## 2023-05-03 MED ORDER — CLONAZEPAM 0.5 MG PO TABS: 0.5000 mg | ORAL_TABLET | Freq: Two times a day (BID) | ORAL | Status: DC

## 2023-05-03 MED ORDER — LOPERAMIDE HCL 2 MG PO CAPS
2.0000 mg | ORAL_CAPSULE | ORAL | Status: DC | PRN
  Administered 2023-05-04: 2 mg via ORAL

## 2023-05-03 NOTE — ED Notes (Signed)
Patient asked if she was still "considred voluntary." I explained that at this time, she was but would still have to talk to a provider first and not take the medication if she was wanting to leave.  She stated she "wasn't asking to leave, she was just asking questions" and she took the medications

## 2023-05-03 NOTE — ED Notes (Addendum)
Patient given ordered and prn meds - patient wants to see provider - message sent to the providers here tonight  Patient having "diarrhea" - provider notified of that as well

## 2023-05-03 NOTE — ED Notes (Signed)
Patient in tears saying her "neuropathy" is "really bad."  She is asking bout flexeril.  Provider made aware

## 2023-05-03 NOTE — ED Provider Notes (Signed)
[facility]gent [facility] Continuous Assessment Admission H&P  Date: 05/03/23 Patient Name: Kayla Silva MRN: [id number] Chief Complaint: Suicidal ideation  Diagnoses:  Final diagnoses:  Bipolar 1 disorder, depressed (HCC)    HPI: Patient presents voluntarily to [facility] behavioral health for walk-in assessment.  Patient is assessed by this nurse practitioner face-to-face.  She is seated in assessment area, no apparent distress.  She is alert and oriented, cooperative during assessment.  Chart reviewed and patient discussed with Dr. Nelly Rout on 05/03/2023.  [name] states "I have been living in an [place] house since January 5th, I have been clean since January 5.  The [place] [facility] dismissed me last night, they said I relapsed."  Patient reports that peers in sober living home feel that she is overmedicated.  Patient states "I may have been overmedicated, I am on a lot of medications, it has affected my behavior at the house and at work." Patient states "I need somewhere to stay for 14 days, I can go back to a different [place] [facility] in 14 days."  [name] followed by outpatient psychiatry at [facility].  She reports most recently seen by [name], virtually, 2 weeks ago.  She states "I trusted her and she kept giving me more medications."  Patient shares she cannot return to [facility] until she pays approximately $250.  Primary care provider at [facility], Dr. [name], prescribed her clonazepam most recently.  Patient states "I do not want to go back to [facility], I want to go to a private psychiatrist, [name] only prescribes clonazepam for a limited amount of time."  She is not linked with outpatient counseling currently.  Would like to be linked with a new therapist.  She endorses diagnoses including bipolar disorder, PTSD, major depressive disorder.  Aside from clonazepam 1 mg 3 times daily patient up so prescribed gabapentin 800mg  TID, Cymbalta 30mg  daily, prazosin 2mg  QHS, Lyrica  300mg  TID, Topamax 25mg  BID and Seroquel 100 mg nightly for sleep.  Most recently 50 mg Seroquel ER added for morning dose.  Vaanya endorses multiple previous inpatient psychiatric hospitalizations.  No family mental health history reported.  Patient denies suicidal and homicidal ideations.  She denied auditory and visual hallucinations.  There is no evidence of delusional thought content and no indication that patient is responding to internal stimuli.  Social work visited patient to attempt to work through shelter options and additional outpatient psychiatry resources, patient endorsed suicidal ideation. Patient continues to endorse suicidal ideation, no plan or intent at this time. She is unable to contract verbally for safety at this time.  Patient states "I cannot be out there, I have nowhere to go, I need help." [name] tearful when reviewing current homeless status, depressed mood.   Patient most recently resided in an [place] in [place].  She denies access to weapons.  She is employed in the [employer].  She endorses average sleep and appetite.  She denies alcohol and substance use.  Patient offered support and encouragement.  Treatment plan includes admission to continuous observation unit for treatment and stabilization.  She will be reassessed on 05/04/2023, disposition will be determined at that time.  Patient confirms desire to remain full CODE STATUS.  She agrees with plan for admission to continuous observation unit.  She states  "I need somewhere to stay for 14 days so that I can go back to my [place] house or different [facility]." Reviewed medications, discussed updates, patient offered opportunity to ask questions.  Total Time spent with patient: 45 minutes  Musculoskeletal  Strength & Muscle Tone: within normal limits Gait & Station: normal Patient leans: N/A  Psychiatric Specialty Exam  Presentation General Appearance:  Appropriate for Environment;  Casual  Eye Contact: Good  Speech: Clear and Coherent; Normal Rate  Speech Volume: Normal  Handedness: Right   Mood and Affect  Mood: Depressed  Affect: Tearful   Thought Process  Thought Processes: Coherent; Goal Directed; Linear  Descriptions of Associations:Intact  Orientation:Full (Time, Place and Person)  Thought Content:Logical; WDL    Hallucinations:Hallucinations: None  Ideas of Reference:None  Suicidal Thoughts:Suicidal Thoughts: Yes, Passive SI Passive Intent and/or Plan: Without Intent; Without Plan  Homicidal Thoughts:Homicidal Thoughts: No   Sensorium  Memory: Immediate Good; Recent Good  Judgment: Intact  Insight: Present   Executive Functions  Concentration: Good  Attention Span: Good  Recall: Good  Fund of Knowledge: Good  Language: Good   Psychomotor Activity  Psychomotor Activity: Psychomotor Activity: Normal   Assets  Assets: Communication Skills; Desire for Improvement; Financial Resources/Insurance; Housing; Resilience; Social Support; Physical Health   Sleep  Sleep: Sleep: Fair   Nutritional Assessment (For OBS and FBC admissions only) Has the patient had a weight loss or gain of 10 pounds or more in the last 3 months?: No Has the patient had a decrease in food intake/or appetite?: No Does the patient have dental problems?: No Does the patient have eating habits or behaviors that may be indicators of an eating disorder including binging or inducing vomiting?: No Has the patient recently lost weight without trying?: 0 Has the patient been eating poorly because of a decreased appetite?: 0 Malnutrition Screening Tool Score: 0    Physical Exam Vitals and nursing note reviewed.  Constitutional:      Appearance: Normal appearance. She is well-developed.  HENT:     Head: Normocephalic and atraumatic.     Nose: Nose normal.  Cardiovascular:     Rate and Rhythm: Normal rate.  Pulmonary:     Effort:  Pulmonary effort is normal.  Musculoskeletal:        General: Normal range of motion.     Cervical back: Normal range of motion.  Skin:    General: Skin is warm and dry.  Neurological:     Mental Status: She is alert and oriented to person, place, and time.  Psychiatric:        Attention and Perception: Attention and perception normal.        Mood and Affect: Mood is depressed. Affect is tearful.        Speech: Speech normal.        Behavior: Behavior normal. Behavior is cooperative.        Thought Content: Thought content includes suicidal ideation.        Cognition and Memory: Cognition and memory normal.    Review of Systems  Constitutional: Negative.   HENT: Negative.    Eyes: Negative.   Respiratory: Negative.    Cardiovascular: Negative.   Gastrointestinal: Negative.   Genitourinary: Negative.   Musculoskeletal: Negative.   Skin: Negative.   Neurological: Negative.   Psychiatric/Behavioral:  Positive for depression and suicidal ideas.     Blood pressure (!) 143/79, pulse 94, temperature 98.5 F (36.9 C), temperature source Oral, resp. rate 18, SpO2 100 %. There is no height or weight on file to calculate BMI.  Past Psychiatric History: Bipolar 1 disorder, bipolar 2 disorder, severe recurrent major depression, PTSD, alcohol dependence, bipolar affective disorder,  drug overdose, suicidal ideation  Is the patient at risk to self? Yes  Has the patient been a risk to self in the past 6 months? No .    Has the patient been a risk to self within the distant past? Yes   Is the patient a risk to others? No   Has the patient been a risk to others in the past 6 months? No   Has the patient been a risk to others within the distant past? No   Past Medical History: Chronic low back pain  Family History: None reported  Social History: employed, resides with supportive peers, hx substance use disorder  Last Labs:  No visits with results within 6 Month(s) from this visit.   Latest known visit with results is:  Admission on 09/07/2020, Discharged on 09/07/2020  Component Date Value Ref Range Status   SARS Coronavirus 2 09/07/2020 NEGATIVE  NEGATIVE Final   Comment: (NOTE) SARS-CoV-2 target nucleic acids are NOT DETECTED.  The SARS-CoV-2 RNA is generally detectable in upper and lower respiratory specimens during the acute phase of infection. The lowest concentration of SARS-CoV-2 viral copies this assay can detect is 250 copies / mL. A negative result does not preclude SARS-CoV-2 infection and should not be used as the sole basis for treatment or other patient management decisions.  A negative result may occur with improper specimen collection / handling, submission of specimen other than nasopharyngeal swab, presence of viral mutation(s) within the areas targeted by this assay, and inadequate number of viral copies (<250 copies / mL). A negative result must be combined with clinical observations, patient history, and epidemiological information.  Fact Sheet for Patients:   BoilerBrush.com.cy  Fact Sheet for Healthcare Providers: https://pope.com/  This test is not yet approved or                           cleared by the [place] FDA and has been authorized for detection and/or diagnosis of SARS-CoV-2 by FDA under an Emergency Use Authorization (EUA).  This EUA will remain in effect (meaning this test can be used) for the duration of the COVID-19 declaration under Section 564(b)(1) of the Act, 21 U.S.C. section 360bbb-3(b)(1), unless the authorization is terminated or revoked sooner.  Performed at [facility], [address], [place], [place] [address]     Allergies: Haldol [haloperidol lactate], Lithium, Valproic acid, and Wellbutrin [bupropion]  Medications:  Facility Ordered Medications  Medication   acetaminophen (TYLENOL) tablet 650 mg   alum & mag hydroxide-simeth (MAALOX/MYLANTA)  200-200-20 MG/5ML suspension 30 mL   magnesium hydroxide (MILK OF MAGNESIA) suspension 30 mL   nicotine (NICODERM CQ - dosed in mg/24 hours) patch 14 mg   prazosin (MINIPRESS) capsule 2 mg   [START ON 05/04/2023] DULoxetine (CYMBALTA) DR capsule 30 mg   clonazePAM (KLONOPIN) tablet 0.5 mg   pregabalin (LYRICA) capsule 300 mg   QUEtiapine (SEROQUEL) tablet 100 mg   [START ON 05/04/2023] atorvastatin (LIPITOR) tablet 10 mg   [START ON 05/04/2023] metoprolol tartrate (LOPRESSOR) tablet 25 mg   [START ON 05/04/2023] gabapentin (NEURONTIN) capsule 800 mg   [START ON 05/04/2023] topiramate (TOPAMAX) tablet 25 mg   PTA Medications  Medication Sig   sertraline (ZOLOFT) 50 MG tablet Take 1 tablet (50 mg total) by mouth daily.   hydrOXYzine (ATARAX/VISTARIL) 25 MG tablet Take 1 tablet (25 mg total) by mouth 3 (three) times daily as needed for anxiety.   traZODone (DESYREL)  100 MG tablet Take 1 tablet (100 mg total) by mouth at bedtime as needed for sleep.   prazosin (MINIPRESS) 2 MG capsule Take 1 capsule (2 mg total) by mouth at bedtime.   nicotine (NICODERM CQ - DOSED IN MG/24 HOURS) 21 mg/24hr patch Place 1 patch (21 mg total) onto the skin daily.   DULoxetine (CYMBALTA) 60 MG capsule Take 60 mg by mouth daily.   OLANZapine (ZYPREXA) 5 MG tablet Take 5 mg by mouth daily.   OLANZapine (ZYPREXA) 10 MG tablet Take 10 mg by mouth at bedtime.   methylPREDNISolone (MEDROL DOSEPAK) 4 MG TBPK tablet Use as directed   benzonatate (TESSALON PERLES) 100 MG capsule Take 1 capsule (100 mg total) by mouth 3 (three) times daily as needed for cough (cough).      Medical Decision Making  Patient remains voluntary.  She will be admitted to facility based crisis unit for treatment and stabilization.  She will be reassessed on 05/04/2023, disposition will be determined at that time.  Decrease clonazepam from 1 mg 3 times daily to 0.5 mg twice daily related to sedation.  Clonazepam dose increased on 04/29/2023 to 1  mg 3 times daily, prior dose 0.5 mg twice daily.  Decreased pregabalin from 300 mg 3 times daily to 300 mg twice daily.  Discontinued Seroquel AM dose 50 mg ER related to daytime sleepiness and sedation. AM added two weeks ago.   Laboratory studies ordered including CBC, CMP, ethanol, magnesium, prolactin and TSH.  Urine pregnancy, urine drug screen ordered.  EKG order initiated.  Current medications: -Acetaminophen 650 mg every 6 as needed/mild pain -Maalox 30 mL oral every 4 as needed/digestion -Magnesium hydroxide 30 mL daily as needed/mild constipation -NicoDerm 14 mg transdermal patch daily/nicotine withdrawal  Initiated prior to admission medications: Decrease clonazepam from 1 mg 3 times daily to 0.5 mg TID daily related to sedation.  Clonazepam dose increased on 04/29/2023 to 1 mg 3 times daily, prior dose 0.5 mg twice daily.  Discontinued pregabalin.  Discontinued Seroquel AM dose 50 mg ER related to daytime sleepiness and sedation.  -Atorvastatin 10 mg daily -Clonazepam 0.5 mg twice daily -Duloxetine DR 30 mg daily -Gabapentin 800 mg 3 times daily -Metoprolol 25mg  daily -Prazosin 2 mg nightly -Quetiapine 100 mg nightly -Topiramate 25 mg twice daily    Recommendations  Based on my evaluation the patient does not appear to have an emergency medical condition.  Lenard Lance, FNP 05/03/23  7:04 PM

## 2023-05-03 NOTE — Progress Notes (Signed)
   05/03/23 1553  [facility] Triage Screening (Walk-ins at [facility] only)  How Did You Hear About [place]? Family/Friend  What Is the Reason for Your Visit/Call Today? Kayla Silva is  a23 year old female  who presents voluntarily to [facility] o being over medicated. Patient denies SI/HI/AHV. 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  She is currrently living at [facility] but feels he is over [employer].  She reprots that she had difficulty staying awake to function in her daily life. She is currenly being prescribed seroquel, olanapin,cymbalta. lyrica, klonopin, prasosin.  She hs mental health diaznosis of PTSD, depression, anxity aznd Bipolar.  How Long Has This Been Causing You Problems? > than 6 months  Have You Recently Had Any Thoughts About Hurting Yourself? No  Are You Planning to Commit Suicide/Harm Yourself At This time? No  Have you Recently Had Thoughts About Hurting Someone [name]? No  Are You Planning To Harm Someone At This Time? No  Are you currently experiencing any auditory, visual or other hallucinations? No  Have You Used Any Alcohol or Drugs in the Past 24 Hours? No  Do you have any current medical co-morbidities that require immediate attention? No  Clinician description of patient physical appearance/behavior: Alert & oriented x4, causually dressed  What Do You Feel Would Help You the Most Today? Medication(s)  If access to [facility] was not available, would you have sought care in the Emergency Department? No  Determination of Need Routine (7 days)  Options For Referral Medication Management

## 2023-05-03 NOTE — ED Notes (Signed)
Offered tylenol to patient - she states "she can't take tylenol"  Per provider she will order volaren gel - patient states "that does not help" Provider notified

## 2023-05-03 NOTE — ED Notes (Signed)
[facility] called to collect STAT specimens and to deliver to [facility]. Tolerated stick x1 to the R AC well.

## 2023-05-03 NOTE — ED Notes (Signed)
Patient crying off and on. Home meds have been re-ordered and will be given at due time. Will continue to monitor for safety

## 2023-05-03 NOTE — ED Notes (Signed)
Providers made aware that patient wants to "leave and go to another hospital."

## 2023-05-03 NOTE — ED Notes (Signed)
Patient back up at nurses station demanding medications - provider made aware
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
## 2023-05-03 NOTE — Care Management Note (Signed)
At the request of the NP writer was asked to discuss with patient options for short-term living arrangements until she can return to an [employer] [facility] [employer] living facility. Upon telling patient several options for shelters, she expresses that she was SI and required mental health treatment at this facility and began to cry. Additionally, patient stated she could not go "out there" until she was mentally stable. Then maybe she could attend [facility] per patient.  Information was related back to NP for final disposition.   Adrian Prows, [facility] 05/03/2023 @ 5:25PM

## 2023-05-03 NOTE — ED Notes (Signed)
Patient is threatening to "become aggressive" (her own words) - provider made aware

## 2023-05-04 MED ORDER — TOPIRAMATE 25 MG PO TABS: 25.0000 mg | ORAL_TABLET | Freq: Two times a day (BID) | ORAL | Status: AC

## 2023-05-04 MED ORDER — DULOXETINE HCL 30 MG PO CPEP: 30.0000 mg | ORAL_CAPSULE | Freq: Every day | ORAL | 3 refills | Status: AC

## 2023-05-04 MED ORDER — NICOTINE 14 MG/24HR TD PT24: 14.0000 mg | MEDICATED_PATCH | TRANSDERMAL | 0 refills | Status: AC

## 2023-05-04 MED ORDER — ATORVASTATIN CALCIUM 10 MG PO TABS: 10.0000 mg | ORAL_TABLET | Freq: Every day | ORAL | Status: AC

## 2023-05-04 MED ORDER — QUETIAPINE FUMARATE 100 MG PO TABS: 100.0000 mg | ORAL_TABLET | Freq: Every day | ORAL | Status: AC

## 2023-05-04 MED ORDER — GABAPENTIN 400 MG PO CAPS: 800.0000 mg | ORAL_CAPSULE | Freq: Three times a day (TID) | ORAL | Status: AC

## 2023-05-04 MED ORDER — CLONAZEPAM 0.5 MG PO TABS: 0.5000 mg | ORAL_TABLET | Freq: Three times a day (TID) | ORAL | 0 refills | Status: AC | PRN
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
## 2023-05-04 NOTE — ED Notes (Signed)
Patient requested medication for aniexty 

## 2023-05-04 NOTE — ED Notes (Signed)
Patient given new med ordered (the one she has been asking for) and is resting with no sxs of distress at this time - will continue to monitor for safety

## 2023-05-04 NOTE — ED Provider Notes (Signed)
This provider was notified by patient's nurse that patient was upset about her medication being tapered down and requesting to be discharged.  Provider presented to the unit to speak with patient and explained in details reasons per documentation and shift report why previous provider Doran Heater, taper down her medication. Patient is c/o muscle spasm and requesting to have home dose of Lyrica 300mg  and Flexeril 10mg  added to back to current medication orders. She says she want to leave AMA if these medications are not given to her. She is  agitated and demanding to leave. This provider consulted with Dr. Gretta Cool. Dr. Viviano Simas recommended one time dose of  Flexeril 10mg  and naproxen 500mg  for muscle spasm. Patient tolerated medication well.

## 2023-05-04 NOTE — ED Notes (Signed)
Patient request medication for aniexty.  

## 2023-05-04 NOTE — ED Notes (Signed)
Patient awake and wanting to shower - toiletries given

## 2023-05-04 NOTE — ED Notes (Signed)
Patient alert and oriented x 3. Denies SI/HI/AVH. Denies intent or plan to harm self or others. Routine conducted according to faculty protocol. Encourage patient to notify staff with any needs or concerns. Patient verbalized agreement and understanding. Will continue to monitor for safety. 

## 2023-05-04 NOTE — ED Notes (Signed)
Patient A&O x 4, ambulatory. Patient discharged in no acute distress. Patient denied SI/HI, A/VH upon discharge. Patient verbalized understanding of all discharge instructions explained by staff, to include follow up appointments, RX's and safety plan. Pt belongings returned to patient from locker #30 intact. Patient escorted to lobby via staff for transport to destination. Safety maintained.  

## 2023-05-04 NOTE — Discharge Instructions (Addendum)
[facility]  [facility] [address] [place], [place], [address] [phone] phone              Offers food and emergency or transitional housing to men, women, or families in need. Clients participate in programs and workshops developed to promote self-sufficiency and personal development.Call or walk in. Applications are accepted Monday, Wednesday, and Friday by appointment only. Need photo ID and proof of income.  [facility] [address], [place], [place] [address] [phone] Population served: Adult men & women (30 years old and older, able to perform activities for daily living) Documents required: Valid ID & Social Security Card  [facility] [address] [place], [place]  [address] [phone] Population served: Families with children  [facility] [address] [place], [place]  [address] [phone] Population served: Single women 18+ without dependents Documents required:  Valid ID & Social Security Card  [facility] [address] [place], [place]  [address] [phone] Population served: Female veterans 18+ with substance abuse/mental health issues Eligibility: By referral only  [facility] [address], [place], [place] [address] [phone] Population served: Males 18+ Documents required: Valid ID & Social Security Card  Room at [employer] [facility] [place] [employer] [address] [place] [address] [phone] or [phone] Population served: Pregnant women with or without children  Documents required: Valid ID & Social Security Ship broker of [employer] [address], [place], [place] [address] [phone] Population Served: Families with children  [facility] [address], [place], [place] [address] [phone] Population served: Men 18+, preference  for disabled and/or veterans Eligibility: By referral only  [facility]) - [facility] [address] [place], [place], [place] [address] [phone] or [phone] Population served: Families with children.    WOMEN ONLY  The Shelter serves up to 20 women each night. Open from December 11th through the end of March in the evenings from 5:30 pm until 7:30 am, the Shelter provides a hot evening meal, shower and sleeping facilities, and food for the next day. Secure parking is available beside the building.     Shelter Address   Directions [facility] [place] [address] [place], [place]  If you are in need of housing through the shelter, contact the [employer] at [phone].    Based on what you have shared, a list of resources for outpatient therapy and psychiatry is provided below to get you started back on treatment.  It is imperative that you follow through with treatment within 5-7 days from the day of discharge to prevent any further risk to your safety or mental well-being.  You are not limited to the list provided.  In case of an urgent crisis, you may contact the Mobile Crisis Unit with [facility] at [phone].        Outpatient Services for Therapy and Medication Management for [facility] [address], [place], [address] [phone] phone  New Patient Assessment/Therapy Walk-ins Monday and Wednesday: 8am until slots are full. Every 1st and 2nd Friday: 1pm - 5pm  NO ASSESSMENT/THERAPY WALK-INS ON TUESDAYS OR THURSDAYS  New Patient Psychiatry/Medication Management Walk-ins Monday-Friday: 8am-11am  For all walk-ins, we ask that you arrive by 7:30am because  patient will be seen in the order of arrival.  Availability is limited; therefore, you may not be seen on the same day that you walk-in.  Our goal is to serve and meet the needs of our  community to the best of our ability.   [facility] [address] [place], [place], [address] [phone] phone  [facility] [address] [place], [place], [address] [phone] phone [phone] phone ([address], [address], Anthem/Elevance, [address], [address], [address], [address], Healthy [place], [place], [place], [address], [employer]oco[employer]illips, Encinal, UHC, American Financial, [place], Out of Network)  [employer], [place] [address] [place], [place], [address] [phone] phone ([place], Anthem/Elevance, Sanmina-SCI Options/Carelon, BCBS, [address], [place], [place], [place], [place], [employer], [place], [place], [place], [facility])  [employer] [address] [place], [place], [address] [phone] phone (Medicaid, ask about other insurance)  [facility] [address] [place], [place], [address] [phone] phone [phone] fax ([address] [place] , [place], [place], [place] Health Choice, UHC, General Electric, Self-Pay)  [facility] [address] [facility] [place], [place], [address] [phone] phone ([address] Anthem/Elevance, [address], [address], [place], CSX Corporation, [place], [place], [place], Har[employer], [place], [place], [place], [facility])  [facility] - 6-8 MONTH WAIT FOR THERAPY; SOONER FOR MEDICATION MANAGEMENT [address], [address] [place], [place], [address] [phone] phone ([address] AmeriHealth [address] - [place], [address], [place], [place], Friday Health Plans, [address], BCBS Healthy [place], [place], [address], [place], [place], [place], [place], Tricare, UHC, Safeco Corporation, [place])  [facility] [address], [address] [place], [place], [address] [phone] phone  [facility] [address] [place], [place], [address] [phone] phone  [facility] [address], [address] [place], [place], [address] [phone] phone  [facility] [place] - THERAPY ONLY [address], [place], [address] [phone] phone  [facility] [place], [place] [address], [place], [address] [phone] phone  [facility] [address] [place], [place], [address] [phone] phone [phone] fax  [facility] [address] [place], [place], [address] [phone] phone [phone] fax  [facility] [phone] [place], [place], [address] [phone] phone  [facility]uss[facility]ul [address] [place], [place], [address]  [phone] phone  [facility]  (Adults Only) [address] [place], [place], [address]  [phone] phone [phone] fax

## 2023-05-04 NOTE — ED Notes (Signed)
Report reports 3 loose stools . Requested medication

## 2023-05-04 NOTE — ED Notes (Signed)
Patient continues to rest with no sxs of distress noted - will continue to monitor for safety 

## 2023-05-04 NOTE — ED Notes (Signed)
Patient  resting in no acute stress. RR even and unlabored .Environment secured .Will continue to monitor for safely. 

## 2023-05-05 LAB — PROLACTIN: Prolactin: 9.7 ng/mL (ref 4.8–33.4)
